# Patient Record
Sex: Male | Born: 1955 | Race: White | Hispanic: No | Marital: Married | Smoking: Current some day smoker
Health system: Southern US, Community
[De-identification: ages and names within clinical notes are randomized; demographics above are authoritative.]

## PROBLEM LIST (undated history)

## (undated) DIAGNOSIS — Z8659 Personal history of other mental and behavioral disorders: Secondary | ICD-10-CM

## (undated) DIAGNOSIS — C4491 Basal cell carcinoma of skin, unspecified: Secondary | ICD-10-CM

## (undated) DIAGNOSIS — I429 Cardiomyopathy, unspecified: Secondary | ICD-10-CM

## (undated) DIAGNOSIS — N200 Calculus of kidney: Secondary | ICD-10-CM

## (undated) HISTORY — PX: SKIN SURGERY: SHX2413

## (undated) HISTORY — DX: Basal cell carcinoma of skin, unspecified: C44.91

## (undated) HISTORY — PX: DEBRIDEMENT TENNIS ELBOW: SHX1442

---

## 1998-05-08 HISTORY — PX: HERNIA REPAIR: SHX51

## 2010-11-24 DIAGNOSIS — N400 Enlarged prostate without lower urinary tract symptoms: Secondary | ICD-10-CM | POA: Insufficient documentation

## 2012-12-21 ENCOUNTER — Emergency Department
Admission: EM | Admit: 2012-12-21 | Discharge: 2012-12-21 | Disposition: A | Discharge status: Home / Self Care | Payer: Managed Care, Other (non HMO) | Source: Home / Self Care | Attending: Family Medicine | Admitting: Family Medicine

## 2012-12-21 DIAGNOSIS — F341 Dysthymic disorder: Secondary | ICD-10-CM

## 2012-12-21 DIAGNOSIS — F329 Major depressive disorder, single episode, unspecified: Secondary | ICD-10-CM

## 2012-12-21 DIAGNOSIS — Z8659 Personal history of other mental and behavioral disorders: Secondary | ICD-10-CM | POA: Insufficient documentation

## 2012-12-21 DIAGNOSIS — F418 Other specified anxiety disorders: Secondary | ICD-10-CM

## 2012-12-21 HISTORY — DX: Personal history of other mental and behavioral disorders: Z86.59

## 2012-12-21 MED ORDER — CITALOPRAM HYDROBROMIDE 20 MG PO TABS: 20.0000 mg | ORAL_TABLET | Freq: Every day | ORAL | Status: DC

## 2012-12-21 MED ORDER — ALPRAZOLAM 1 MG PO TABS: ORAL_TABLET | ORAL | Status: DC

## 2012-12-21 NOTE — ED Notes (Signed)
Apollos states he has a lot of stress with his job. He has had loss of appetite, weight loss and trouble sleeping. He has felt this before and was treated with citalopram and xanax.

## 2012-12-21 NOTE — ED Provider Notes (Signed)
CSN: [id number]     Arrival date & time 12/21/12  1349 History     First MD Initiated Contact with Patient 12/21/12 1423     Chief Complaint  Patient presents with  . Anxiety    for 1 month, worse for 1 week  . Anorexia    for 1 month, worse for 1 week  . Insomnia    for 1 week      HPI Comments: Patient reports that he has been under increased stress at work for about a month.  He has become increasingly depressed and anxious, worse during the past week.  He states that his mind "races" and he has difficulty concentrating.  He has difficulty falling asleep and early morning awakening.  He has occasional mild panic attacks that he controls by slowing his respiration.  His appetite has decreased and he has lost about 10 pounds over the past month.  He denies suicidal thoughts.  He states that he had been depressed about 5 years ago, and was successfully treated with Celexa for 3 years.  His anxiety symptoms at that time were controlled with Xanax 1mg .  He has a history of Barrett's esophagus, and is scheduled to undergo follow-up endoscopy next month.  He will also be undergoing screening colonoscopy.                              The history is provided by the patient.    Past Medical History  Diagnosis Date  . History of anxiety    Past Surgical History  Procedure Laterality Date  . Hernia repair    . Debridement tennis elbow     History reviewed. No pertinent family history. History  Substance Use Topics  . Smoking status: Current Every Day Smoker -- 1.00 packs/day for 40 years    Types: Cigarettes  . Smokeless tobacco: Never Used  . Alcohol Use: No    Review of Systems  Constitutional: Positive for fever, activity change, appetite change, fatigue and unexpected weight change. Negative for chills and diaphoresis.  HENT: Negative.   Eyes: Negative.   Respiratory: Negative.   Cardiovascular: Negative.   Gastrointestinal: Positive for abdominal pain. Negative for nausea,  vomiting, diarrhea and blood in stool.  Endocrine: Negative.   Genitourinary: Negative for difficulty urinating.  Musculoskeletal: Negative.   Skin: Negative.   Neurological: Negative.   Psychiatric/Behavioral: Positive for confusion, sleep disturbance and decreased concentration. Negative for suicidal ideas, hallucinations, behavioral problems and agitation. The patient is nervous/anxious.     Allergies  Codeine  Home Medications   Current Outpatient Rx  Name  Route  Sig  Dispense  Refill  . ALPRAZolam (XANAX) 1 MG tablet      Take one tab PO BID to TID prn anxiety   20 tablet   0   . citalopram (CELEXA) 20 MG tablet   Oral   Take 1 tablet (20 mg total) by mouth daily.   10 tablet   0    BP 115/77  Pulse 78  Temp(Src) 98.4 F (36.9 C) (Oral)  Ht 5\' 10"  (1.778 m)  Wt 118 lb (53.524 kg)  BMI 16.93 kg/m2  SpO2 98% Physical Exam Nursing notes and Vital Signs reviewed. Appearance:  Patient appears older than stated age, and in no acute distress.   Psychiatric:  Patient is alert and oriented with good eye contact.  Thoughts are organized.  No psychomotor retardation.  Memory intact.  Not suicidal.  Affect is flat and mood mildly depressed.  Eyes:  Pupils are equal, round, and reactive to light and accomodation.  Extraocular movement is intact.  Conjunctivae are not inflamed  Pharynx:  Normal Neck:  Supple.   No adenopathy or thyromegaly.  Lungs:  Clear to auscultation.  Breath sounds are equal.  Heart:  Regular rate and rhythm without murmurs, rubs, or gallops.  Abdomen:  Nontender without masses or hepatosplenomegaly.  Bowel sounds are present.  No CVA or flank tenderness.  Extremities:  No edema.  No calf tenderness Skin:  No rash present.   ED Course   Procedures none    1. Depression   2. Anxiety associated with depression     MDM  Begin Celexa 20mg  daily, Xanax 1mg  bid to tid prn Followup with PCP in one week.  Lattie Haw, MD 12/21/12 1640

## 2012-12-27 ENCOUNTER — Encounter: Payer: Self-pay | Admitting: Family Medicine

## 2012-12-27 ENCOUNTER — Ambulatory Visit (INDEPENDENT_AMBULATORY_CARE_PROVIDER_SITE_OTHER): Payer: Managed Care, Other (non HMO) | Admitting: Family Medicine

## 2012-12-27 VITALS — BP 110/68 | HR 63 | Ht 70.0 in | Wt 120.0 lb

## 2012-12-27 DIAGNOSIS — Z131 Encounter for screening for diabetes mellitus: Secondary | ICD-10-CM

## 2012-12-27 DIAGNOSIS — F411 Generalized anxiety disorder: Secondary | ICD-10-CM | POA: Insufficient documentation

## 2012-12-27 DIAGNOSIS — Z13220 Encounter for screening for lipoid disorders: Secondary | ICD-10-CM

## 2012-12-27 LAB — BASIC METABOLIC PANEL WITH GFR
BUN: 25 mg/dL — ABNORMAL HIGH (ref 6–23)
Chloride: 108 mEq/L (ref 96–112)
GFR, Est Non African American: 78 mL/min
Potassium: 4.4 mEq/L (ref 3.5–5.3)
Sodium: 143 mEq/L (ref 135–145)

## 2012-12-27 LAB — LIPID PANEL
Cholesterol: 113 mg/dL (ref 0–200)
VLDL: 27 mg/dL (ref 0–40)

## 2012-12-27 MED ORDER — CITALOPRAM HYDROBROMIDE 20 MG PO TABS: 20.0000 mg | ORAL_TABLET | Freq: Every day | ORAL | Status: DC

## 2012-12-27 MED ORDER — ALPRAZOLAM 1 MG PO TABS: ORAL_TABLET | ORAL | Status: DC

## 2012-12-27 NOTE — Progress Notes (Signed)
CC: Dylan Potter is a 57 y.o. male is here for Establish Care   Subjective: HPI:  Very pleasant 57 year old here to establish care  Patient complains of one month of restlessness racing thoughts trouble sleeping upset stomach and poor appetite all described as moderate severity present on a daily basis worsening since onset. Symptoms seem to have started sometime around his wife's surgery on her cervical spine along with increased job responsibilities at work.  He reports similar symptoms approximately 10 years ago he had been on Lexapro at that time along with alprazolam as needed he tapered himself off years ago has not been on any medications for the past at least 4 years. Last week he started citalopram on a daily basis he has noticed a big improvement with now lack of appetite or abdominal complaints. He still has occasional racing thoughts nothing in particular at night however this is also greatly improved with Xanax only at night. No longer having trouble sleeping no longer having restlessness but does report mild subjective anxiety. He is quite happy with his response already. No family history of thyroid disease he tells me this has been checked within the last few years. He denies paranoia, hallucinations, nor subjective depression  Review of Systems - General ROS: negative for - chills, fever, night sweats, weight gain or weight loss Ophthalmic ROS: negative for - decreased vision Psychological ROS: negative for -  depression ENT ROS: negative for - hearing change, nasal congestion, tinnitus or allergies Hematological and Lymphatic ROS: negative for - bleeding problems, bruising or swollen lymph nodes Breast ROS: negative Respiratory ROS: no cough, shortness of breath, or wheezing Cardiovascular ROS: no chest pain or dyspnea on exertion Gastrointestinal ROS: no abdominal pain, change in bowel habits, or black or bloody stools Genito-Urinary ROS: negative for - genital discharge, genital  ulcers, incontinence or abnormal bleeding from genitals Musculoskeletal ROS: negative for - joint pain or muscle pain Neurological ROS: negative for - headaches or memory loss Dermatological ROS: negative for lumps, mole changes, rash and skin lesion changes  Past Medical History  Diagnosis Date  . History of anxiety      History reviewed. No pertinent family history.   History  Substance Use Topics  . Smoking status: Current Every Day Smoker -- 1.00 packs/day for 40 years    Types: Cigarettes  . Smokeless tobacco: Never Used  . Alcohol Use: No     Objective: Filed Vitals:   12/27/12 0903  BP: 110/68  Pulse: 63    General: Alert and Oriented, No Acute Distress HEENT: Pupils equal, round, reactive to light. Conjunctivae clear.  External ears unremarkable, canals clear with intact TMs with appropriate landmarks.  Middle ear appears open without effusion. Pink inferior turbinates.  Moist mucous membranes, pharynx without inflammation nor lesions.  Neck supple without palpable lymphadenopathy nor abnormal masses. Lungs: Clear to auscultation bilaterally, no wheezing/ronchi/rales.  Comfortable work of breathing. Good air movement. Cardiac: Regular rate and rhythm. Normal S1/S2.  No murmurs, rubs, nor gallops.   Abdomen:  soft nontender  Extremities: No peripheral edema.  Strong peripheral pulses.  Mental Status: No depression, anxiety, nor agitation. Skin: Warm and dry.  Assessment & Plan: Dylan Potter was seen today for establish care.  Diagnoses and associated orders for this visit:  Generalized anxiety disorder - ALPRAZolam (XANAX) 1 MG tablet; One by mouth only as needed for sleep. - citalopram (CELEXA) 20 MG tablet; Take 1 tablet (20 mg total) by mouth daily.  Diabetes mellitus screening - BASIC  METABOLIC PANEL WITH GFR  Screening, lipid - Lipid panel    Anxiety: Improved continue citalopram, we discussed we can increase this dose if needed however we're both optimistic  that this will continue to be improving, continue as needed alprazolam for sleep He is due for lipid screen and diabetic screening.  Return in about 3 months (around 03/29/2013) for anxiety.

## 2013-02-23 ENCOUNTER — Other Ambulatory Visit: Payer: Self-pay | Admitting: Family Medicine

## 2013-02-24 NOTE — Telephone Encounter (Signed)
Dylan Potter, Rx placed in in-box ready for pickup/faxing.  

## 2013-03-13 ENCOUNTER — Other Ambulatory Visit: Payer: Self-pay

## 2013-03-31 ENCOUNTER — Encounter: Payer: Self-pay | Admitting: Family Medicine

## 2013-03-31 ENCOUNTER — Ambulatory Visit (INDEPENDENT_AMBULATORY_CARE_PROVIDER_SITE_OTHER): Payer: Managed Care, Other (non HMO) | Admitting: Family Medicine

## 2013-03-31 VITALS — BP 113/78 | HR 75 | Wt 126.0 lb

## 2013-03-31 DIAGNOSIS — F411 Generalized anxiety disorder: Secondary | ICD-10-CM

## 2013-03-31 DIAGNOSIS — K222 Esophageal obstruction: Secondary | ICD-10-CM | POA: Insufficient documentation

## 2013-03-31 MED ORDER — ALPRAZOLAM 1 MG PO TABS: ORAL_TABLET | ORAL | Status: DC

## 2013-03-31 MED ORDER — CITALOPRAM HYDROBROMIDE 40 MG PO TABS: 40.0000 mg | ORAL_TABLET | Freq: Every day | ORAL | Status: DC

## 2013-03-31 NOTE — Progress Notes (Signed)
CC: Dylan Potter is a 57 y.o. male is here for anxiety and depression f/u   Subjective: HPI:  Followup generalized anxiety disorder: Symptoms of mood swings and irritability with subjective anxiety was improving following his wife's recovery from neck surgery however over the past one-2 months symptoms have been worsening from mild to moderate severely on a daily basis due to leaving his job and being unsuccessful with looking for a new job. Symptoms are improved in the evening with taking alprazolam, he feels that citalopram 20 mg is helping some but that there is room for improvement.[name] denies subjective depression, mental disturbance other than above. Denies paranoia, insomnia.   Review Of Systems Outlined In HPI  Past Medical History  Diagnosis Date  . History of anxiety      Family History  Problem Relation Age of Onset  . Hyperlipidemia Mother      History  Substance Use Topics  . Smoking status: Current Every Day Smoker -- 1.00 packs/day for 40 years    Types: Cigarettes  . Smokeless tobacco: Never Used  . Alcohol Use: No     Objective: Filed Vitals:   03/31/13 0828  BP: 113/78  Pulse: 75    Vital signs reviewed. General: Alert and Oriented, No Acute Distress HEENT: Pupils equal, round, reactive to light. Conjunctivae clear.  External ears unremarkable.  Moist mucous membranes. Lungs: Clear and comfortable work of breathing, speaking in full sentences without accessory muscle use. Cardiac: Regular rate and rhythm.  Neuro: CN II-XII grossly intact, gait normal. Extremities: No peripheral edema.  Strong peripheral pulses.  Mental Status: No depression, anxiety, nor agitation. Logical though process. Skin: Warm and dry.  Assessment & Plan: Sergi was seen today for anxiety and depression f/u.  Diagnoses and associated orders for this visit:  Generalized anxiety disorder - citalopram (CELEXA) 40 MG tablet; Take 1 tablet (40 mg total) by mouth daily. - ALPRAZolam  (XANAX) 1 MG tablet; TAKE 1 TABLET BY MOUTH AT BEDTIME  Esophageal stricture    Generalized anxiety disorder: Uncontrolled, increasing citalopram return in one month if quality of life has not significantly improved. Continue as needed alprazolam at bedtime only.   Return in about 3 months (around 07/01/2013).

## 2013-05-19 ENCOUNTER — Encounter: Payer: Self-pay | Admitting: Family Medicine

## 2013-05-19 ENCOUNTER — Telehealth: Payer: Self-pay | Admitting: Family Medicine

## 2013-05-19 DIAGNOSIS — F411 Generalized anxiety disorder: Secondary | ICD-10-CM

## 2013-05-19 MED ORDER — ALPRAZOLAM 1 MG PO TABS: ORAL_TABLET | ORAL | Status: DC

## 2013-05-19 NOTE — Telephone Encounter (Signed)
Sue LushAndrea, Rx placed in in-box ready for pickup/faxing.   ----- Message ----- From: Ulis Riasavid Troupe Sent: 05/19/2013 11:05 AM To: [facility] Clinical Pool Subject: Non-Urgent Medical Question I was e-mailing to get a refill authorization on Alprazolam 1MG  from DR. Barrie Sigmund. Thank-You

## 2013-05-20 NOTE — Telephone Encounter (Signed)
rx faxed

## 2013-07-01 ENCOUNTER — Ambulatory Visit: Payer: Self-pay | Admitting: Family Medicine

## 2013-07-01 ENCOUNTER — Ambulatory Visit (INDEPENDENT_AMBULATORY_CARE_PROVIDER_SITE_OTHER): Payer: Self-pay | Admitting: Family Medicine

## 2013-07-01 ENCOUNTER — Encounter: Payer: Self-pay | Admitting: Family Medicine

## 2013-07-01 VITALS — BP 106/61 | HR 67 | Temp 97.0°F | Ht 70.0 in | Wt 130.0 lb

## 2013-07-01 DIAGNOSIS — G4700 Insomnia, unspecified: Secondary | ICD-10-CM | POA: Insufficient documentation

## 2013-07-01 DIAGNOSIS — F411 Generalized anxiety disorder: Secondary | ICD-10-CM

## 2013-07-01 MED ORDER — TEMAZEPAM 7.5 MG PO CAPS: 7.5000 mg | ORAL_CAPSULE | Freq: Every evening | ORAL | Status: DC | PRN

## 2013-07-01 MED ORDER — TEMAZEPAM 15 MG PO CAPS: ORAL_CAPSULE | ORAL | Status: DC

## 2013-07-01 NOTE — Progress Notes (Signed)
CC: Dylan Potter is a 58 y.o. male is here for Medication Dose Change   Subjective: HPI:  Followup anxiety: Since her last visit he went on citalopram was taking 40 mg on a daily basis soon after this adjustment he developed night sweats and worsening insomnia. Cut back to 20 mg with immediate resolution of above anxiety. Currently anxiety is well controlled he denies any mental disturbance or depression other than trouble with insomnia.    complains of worsening insomnia over the past 2 months on a nightly basis. Nothing particularly makes this worse he cannot identify anything that is causing the racing thoughts that are keeping him awake. He denies paranoia or reoccurring thoughts causing him difficulty sleeping. He finds this as difficulty getting asleep and staying asleep. Slightly improved with taking 150% of his prescribe Xanax every night however he believes he is building a tolerance to this. Denies fevers, chills, unintentional weight loss or gain. Reports over a decade ago he tried Ambien and Lunesta for separate occasions without much benefit   Review Of Systems Outlined In HPI  Past Medical History  Diagnosis Date  . History of anxiety     Past Surgical History  Procedure Laterality Date  . Hernia repair  2000  . Debridement tennis elbow  2001, 2002   Family History  Problem Relation Age of Onset  . Hyperlipidemia Mother     History   Social History  . Marital Status: Married    Spouse Name: N/A    Number of Children: N/A  . Years of Education: N/A   Occupational History  . Not on file.   Social History Main Topics  . Smoking status: Current Every Day Smoker -- 1.00 packs/day for 40 years    Types: Cigarettes  . Smokeless tobacco: Never Used  . Alcohol Use: No  . Drug Use: No  . Sexual Activity: Yes   Other Topics Concern  . Not on file   Social History Narrative  . No narrative on file     Objective: BP 106/61  Pulse 67  Temp(Src) 97 F (36.1 C)   Ht 5\' 10"  (1.778 m)  Wt 130 lb (58.968 kg)  BMI 18.65 kg/m2  SpO2 97%  Vital signs reviewed. General: Alert and Oriented, No Acute Distress HEENT: Pupils equal, round, reactive to light. Conjunctivae clear.  External ears unremarkable.  Moist mucous membranes. Lungs: Clear and comfortable work of breathing, speaking in full sentences without accessory muscle use. Cardiac: Regular rate and rhythm.  Neuro: CN II-XII grossly intact, gait normal. Extremities: No peripheral edema.  Strong peripheral pulses.  Mental Status: No depression, anxiety, nor agitation. Logical though process. Skin: Warm and dry.  Assessment & Plan: [name] was seen today for medication dose change.  Diagnoses and associated orders for this visit:  Generalized anxiety disorder  Insomnia - temazepam (RESTORIL) 15 MG capsule; Start with half tablet at bedtime daily for the first five days the full tablet at bedtime.  Other Orders - temazepam (RESTORIL) 7.5 MG capsule; Take 1 capsule (7.5 mg total) by mouth at bedtime as needed for sleep.    Generalized anxiety disorder: Controlled continue citalopram 20 mg   insomnia: Uncontrolled chronic condition tapering off of Xanax to take 0.5 mg on a nightly basis for 5 days along with 7.5 temazepam a nightly basis for 5 days then completely stopped Xanax switch to temazepam   Return in about 3 months (around 09/28/2013).

## 2013-07-07 ENCOUNTER — Encounter: Payer: Self-pay | Admitting: Family Medicine

## 2013-07-08 ENCOUNTER — Telehealth: Payer: Self-pay | Admitting: *Deleted

## 2013-07-08 MED ORDER — ALPRAZOLAM 2 MG PO TABS: 1.0000 mg | ORAL_TABLET | Freq: Every evening | ORAL | Status: DC | PRN

## 2013-07-08 NOTE — Telephone Encounter (Signed)
New xanax rx in Andrea's inbox, please inform patient that the dose of this was increased from 1mg  to 2mg  to avoid any confusion.

## 2013-07-08 NOTE — Telephone Encounter (Signed)
Pt called and sent an email stating the temazepam causing him to have HA, night sweats, still not helping with sleep. Pt wants to switch back to xanax.Please advise

## 2013-07-09 NOTE — Telephone Encounter (Signed)
Pt notified and rx faxed 

## 2013-07-30 ENCOUNTER — Encounter: Payer: Self-pay | Admitting: Family Medicine

## 2013-07-31 ENCOUNTER — Other Ambulatory Visit: Payer: Self-pay | Admitting: Family Medicine

## 2013-08-05 MED ORDER — ZOLPIDEM TARTRATE 10 MG PO TABS: 10.0000 mg | ORAL_TABLET | Freq: Every evening | ORAL | Status: DC | PRN

## 2013-08-07 ENCOUNTER — Telehealth: Payer: Self-pay | Admitting: Family Medicine

## 2013-08-11 MED ORDER — ALPRAZOLAM 2 MG PO TABS: 1.0000 mg | ORAL_TABLET | Freq: Every evening | ORAL | Status: DC | PRN

## 2013-08-11 NOTE — Telephone Encounter (Signed)
Dylan Potter, Rx placed in in-box ready for pickup/faxing.  

## 2013-08-11 NOTE — Telephone Encounter (Signed)
rx has been faxed 

## 2013-09-02 ENCOUNTER — Encounter: Payer: Self-pay | Admitting: Family Medicine

## 2013-09-02 MED ORDER — TEMAZEPAM 30 MG PO CAPS: 30.0000 mg | ORAL_CAPSULE | Freq: Every evening | ORAL | Status: DC | PRN

## 2013-09-02 NOTE — Telephone Encounter (Signed)
Dylan Potter, Rx placed in in-box ready for pickup/faxing.  

## 2013-09-25 ENCOUNTER — Other Ambulatory Visit: Payer: Self-pay | Admitting: Family Medicine

## 2013-09-30 ENCOUNTER — Ambulatory Visit: Payer: Self-pay | Admitting: Family Medicine

## 2013-10-10 ENCOUNTER — Telehealth: Payer: Self-pay | Admitting: Family Medicine

## 2013-10-10 ENCOUNTER — Other Ambulatory Visit: Payer: Self-pay | Admitting: Family Medicine

## 2013-10-13 MED ORDER — TEMAZEPAM 30 MG PO CAPS: 30.0000 mg | ORAL_CAPSULE | Freq: Every evening | ORAL | Status: DC | PRN

## 2013-10-13 NOTE — Telephone Encounter (Signed)
Dylan Potter, Rx placed in in-box ready for pickup/faxing.  

## 2013-10-13 NOTE — Telephone Encounter (Signed)
faxed

## 2013-12-15 ENCOUNTER — Other Ambulatory Visit: Payer: Self-pay | Admitting: Family Medicine

## 2013-12-15 MED ORDER — TEMAZEPAM 30 MG PO CAPS: 30.0000 mg | ORAL_CAPSULE | Freq: Every evening | ORAL | Status: DC | PRN

## 2013-12-19 ENCOUNTER — Encounter: Payer: Self-pay | Admitting: Family Medicine

## 2013-12-19 ENCOUNTER — Ambulatory Visit (INDEPENDENT_AMBULATORY_CARE_PROVIDER_SITE_OTHER): Payer: Self-pay | Admitting: Family Medicine

## 2013-12-19 VITALS — BP 133/85 | HR 64 | Temp 97.6°F | Wt 130.0 lb

## 2013-12-19 DIAGNOSIS — G4700 Insomnia, unspecified: Secondary | ICD-10-CM

## 2013-12-19 DIAGNOSIS — R319 Hematuria, unspecified: Secondary | ICD-10-CM | POA: Insufficient documentation

## 2013-12-19 DIAGNOSIS — F411 Generalized anxiety disorder: Secondary | ICD-10-CM

## 2013-12-19 LAB — POCT URINALYSIS DIPSTICK
BILIRUBIN UA: NEGATIVE
GLUCOSE UA: NEGATIVE
Ketones, UA: NEGATIVE
Leukocytes, UA: NEGATIVE
Nitrite, UA: NEGATIVE
Protein, UA: NEGATIVE
Urobilinogen, UA: 0.2
pH, UA: 5.5

## 2013-12-19 NOTE — Progress Notes (Signed)
CC: Dylan Potter is a 58 y.o. male is here for Hematuria   Subjective: HPI:  Patient reports diagnosis of hematuria that was noticed that a physical he had done earlier this week. He has not noticed any gross hematuria recently or remotely. He denies any penile discharge, dysuria, testicular pain, flank pain, fevers, chills, weak urine stream, needing to awaken in the middle of the night to urinate, nor any bleeding problems recently or remotely. Does not take any anti-coagulation or antiplatelet medication. No family history of prostate nor bladder cancer  Followup insomnia: States that temazepam is working great to fall sleep and stay asleep. Denies known intolerance or side effects. Reports restorative sleep on a daily/nightly basis  Followup anxiety: Continues to take citalopram on a daily basis without known side effects denies intolerance. Denies any anxiety or any other mental disturbance   Review Of Systems Outlined In HPI  Past Medical History  Diagnosis Date  . History of anxiety     Past Surgical History  Procedure Laterality Date  . Hernia repair  2000  . Debridement tennis elbow  2001, 2002   Family History  Problem Relation Age of Onset  . Hyperlipidemia Mother     History   Social History  . Marital Status: Married    Spouse Name: N/A    Number of Children: N/A  . Years of Education: N/A   Occupational History  . Not on file.   Social History Main Topics  . Smoking status: Current Every Day Smoker -- 1.00 packs/day for 40 years    Types: Cigarettes  . Smokeless tobacco: Never Used  . Alcohol Use: No  . Drug Use: No  . Sexual Activity: Yes   Other Topics Concern  . Not on file   Social History Narrative  . No narrative on file     Objective: BP 133/85  Pulse 64  Temp(Src) 97.6 F (36.4 C) (Oral)  Wt 130 lb (58.968 kg)  Vital signs reviewed. General: Alert and Oriented, No Acute Distress HEENT: Pupils equal, round, reactive to light.  Conjunctivae clear.  External ears unremarkable.  Moist mucous membranes. Lungs: Clear and comfortable work of breathing, speaking in full sentences without accessory muscle use. Cardiac: Regular rate and rhythm.  Neuro: CN II-XII grossly intact, gait normal. Extremities: No peripheral edema.  Strong peripheral pulses.  Mental Status: No depression, anxiety, nor agitation. Logical though process. Skin: Warm and dry. Assessment & Plan: Dylan Potter was seen today for hematuria.  Diagnoses and associated orders for this visit:  Hematuria - Urine Culture - Urinalysis Dipstick - Urinalysis, microscopic only  Generalized anxiety disorder  Insomnia    Hematuria: Obtain culture rule out UTI, obtaining microscopic analysis to quantify RBCs to help determine if further workup is needed. Generalized anxiety disorder: Controlled continue citalopram Insomnia: Controlled continue Restoril   Return if symptoms worsen or fail to improve.

## 2013-12-20 LAB — URINALYSIS, MICROSCOPIC ONLY
BACTERIA UA: NONE SEEN
CASTS: NONE SEEN
CRYSTALS: NONE SEEN
Squamous Epithelial / LPF: NONE SEEN

## 2013-12-21 LAB — URINE CULTURE
COLONY COUNT: NO GROWTH
Organism ID, Bacteria: NO GROWTH

## 2013-12-22 ENCOUNTER — Telehealth: Payer: Self-pay | Admitting: Family Medicine

## 2013-12-22 DIAGNOSIS — R319 Hematuria, unspecified: Secondary | ICD-10-CM

## 2013-12-22 NOTE — Telephone Encounter (Signed)
Dylan Potter, Will you please let patient know that the amount of blood in his urine is not considered to be significant for further workup.  This is very reassuring and not a sign of any significant disease.  I'll write a letter that he can share with his potential employer (placed in your inbox).

## 2013-12-22 NOTE — Telephone Encounter (Signed)
Pt.notified

## 2014-01-16 ENCOUNTER — Encounter: Payer: Self-pay | Admitting: Family Medicine

## 2014-01-21 ENCOUNTER — Other Ambulatory Visit: Payer: Self-pay | Admitting: *Deleted

## 2014-01-21 DIAGNOSIS — F411 Generalized anxiety disorder: Secondary | ICD-10-CM

## 2014-01-21 MED ORDER — TEMAZEPAM 30 MG PO CAPS: 30.0000 mg | ORAL_CAPSULE | Freq: Every evening | ORAL | Status: DC | PRN

## 2014-01-21 MED ORDER — CITALOPRAM HYDROBROMIDE 40 MG PO TABS: 40.0000 mg | ORAL_TABLET | Freq: Every day | ORAL | Status: DC

## 2014-03-17 ENCOUNTER — Other Ambulatory Visit: Payer: Self-pay | Admitting: Family Medicine

## 2014-03-18 ENCOUNTER — Encounter: Payer: Self-pay | Admitting: Family Medicine

## 2014-03-19 ENCOUNTER — Other Ambulatory Visit: Payer: Self-pay

## 2014-03-19 MED ORDER — TEMAZEPAM 30 MG PO CAPS: 30.0000 mg | ORAL_CAPSULE | Freq: Every evening | ORAL | Status: DC | PRN

## 2014-03-19 NOTE — Telephone Encounter (Signed)
Patient request refill for Temazepam 30 mg #30 1 Refill has been faxed to [facility]. Estelle Junehonda Cunningham,CMA

## 2014-05-22 ENCOUNTER — Other Ambulatory Visit: Payer: Self-pay | Admitting: *Deleted

## 2014-05-22 MED ORDER — TEMAZEPAM 30 MG PO CAPS: 30.0000 mg | ORAL_CAPSULE | Freq: Every evening | ORAL | Status: DC | PRN

## 2014-05-24 ENCOUNTER — Other Ambulatory Visit: Payer: Self-pay | Admitting: Family Medicine

## 2014-10-16 ENCOUNTER — Ambulatory Visit (INDEPENDENT_AMBULATORY_CARE_PROVIDER_SITE_OTHER): Payer: Self-pay | Admitting: Family Medicine

## 2014-10-16 ENCOUNTER — Encounter: Payer: Self-pay | Admitting: Family Medicine

## 2014-10-16 VITALS — BP 131/83 | HR 77 | Ht 70.0 in | Wt 131.0 lb

## 2014-10-16 DIAGNOSIS — R1903 Right lower quadrant abdominal swelling, mass and lump: Secondary | ICD-10-CM

## 2014-10-16 DIAGNOSIS — R319 Hematuria, unspecified: Secondary | ICD-10-CM

## 2014-10-16 DIAGNOSIS — G4700 Insomnia, unspecified: Secondary | ICD-10-CM

## 2014-10-16 LAB — POCT URINALYSIS DIPSTICK
BILIRUBIN UA: NEGATIVE
GLUCOSE UA: NEGATIVE
KETONES UA: NEGATIVE
LEUKOCYTES UA: NEGATIVE
Nitrite, UA: NEGATIVE
PH UA: 6.5
Protein, UA: NEGATIVE
SPEC GRAV UA: 1.02
UROBILINOGEN UA: 0.2

## 2014-10-16 MED ORDER — ALPRAZOLAM 2 MG PO TABS: 1.0000 mg | ORAL_TABLET | Freq: Every evening | ORAL | Status: DC | PRN

## 2014-10-16 NOTE — Progress Notes (Signed)
CC: Dylan Potter is a 59 y.o. male is here for Hematuria   Subjective: HPI:  Recent preemployment physical he was noted to have blood on her urinalysis dipstick. He denies any gross blood or dysuria. He's had this situation in the past and under microscopy there was an insignificant amount of red blood cells. He denies any urinary symptoms or penile discharge  He also accidentally listed that he has sleep apnea thinking that this was synonymous with insomnia she does suffer from. Currently this is well controlled though with the use of alprazolam at night right before bed. He has no history of sleep apnea  When he was being examined the practitioner felt that he could have a hernia in the rightgroin and lower quadrant. He admits that he does have some softness in the right groin compared to the left because of mesh that was placed in the left groin for hernia years ago. No operation is occurred in the right groin or right lower quadrant . he denies any pain or gastrointestinal complaints diarrhea constipation or nausea  Review Of Systems Outlined In HPI  Past Medical History  Diagnosis Date  . History of anxiety     Past Surgical History  Procedure Laterality Date  . Hernia repair  2000  . Debridement tennis elbow  2001, 2002   Family History  Problem Relation Age of Onset  . Hyperlipidemia Mother     History   Social History  . Marital Status: Married    Spouse Name: N/A  . Number of Children: N/A  . Years of Education: N/A   Occupational History  . Not on file.   Social History Main Topics  . Smoking status: Current Every Day Smoker -- 1.00 packs/day for 40 years    Types: Cigarettes  . Smokeless tobacco: Never Used  . Alcohol Use: No  . Drug Use: No  . Sexual Activity: Yes   Other Topics Concern  . Not on file   Social History Narrative     Objective: BP 131/83 mmHg  Pulse 77  Ht 5\' 10"  (1.778 m)  Wt 131 lb (59.421 kg)  BMI 18.80 kg/m2  Vital signs  reviewed. General: Alert and Oriented, No Acute Distress HEENT: Pupils equal, round, reactive to light. Conjunctivae clear.  External ears unremarkable.  Moist mucous membranes. Lungs: Clear and comfortable work of breathing, speaking in full sentences without accessory muscle use. Cardiac: Regular rate and rhythm.  Neuro: CN II-XII grossly intact, gait normal. Abdomen: Soft nontender no rebound or guarding. No palpable masses. The right groin is a little bit softer than the left however no palpable hernia. Extremities: No peripheral edema.  Strong peripheral pulses.  Mental Status: No depression, anxiety, nor agitation. Logical though process. Skin: Warm and dry.  Assessment & Plan: Dylan Potter was seen today for hematuria.  Diagnoses and all orders for this visit:  Hematuria Orders: -     Urinalysis, microscopic only -     POCT Urinalysis Dipstick  Insomnia  Right lower quadrant abdominal mass Orders: -     alprazolam (XANAX) 2 MG tablet; Take 0.5-1 tablets (1-2 mg total) by mouth at bedtime as needed for sleep.    Needs letter regarding blood in urine situation, that he does not have sleep apnea and listed this by mistake thinking it was synonymous with insomnia, and that he does not have a hernia.  Checking microscopic urinalysis to determine if he has microscopic hematuria and needs a further workup. The results of  this need to be faxed to [facility] @ [phone]  Return if symptoms worsen or fail to improve.

## 2014-10-17 LAB — URINALYSIS, MICROSCOPIC ONLY
Bacteria, UA: NONE SEEN
CRYSTALS: NONE SEEN
Casts: NONE SEEN
Squamous Epithelial / LPF: NONE SEEN

## 2014-10-19 ENCOUNTER — Telehealth: Payer: Self-pay | Admitting: Family Medicine

## 2014-10-19 NOTE — Telephone Encounter (Signed)
faxed

## 2014-10-19 NOTE — Telephone Encounter (Signed)
Sue Lush, Will you please fax Mr. Fieldhouse' letter that I put in your inbox to [phone] and then mail it to Mr. Fodera?

## 2014-12-06 ENCOUNTER — Other Ambulatory Visit: Payer: Self-pay | Admitting: Family Medicine

## 2014-12-08 NOTE — Telephone Encounter (Signed)
Dylan Potter, Rx placed in in-box ready for pickup/faxing.  

## 2015-02-01 ENCOUNTER — Encounter: Payer: Self-pay | Admitting: Family Medicine

## 2015-02-22 ENCOUNTER — Other Ambulatory Visit: Payer: Self-pay | Admitting: Family Medicine

## 2015-02-25 ENCOUNTER — Other Ambulatory Visit: Payer: Self-pay | Admitting: Family Medicine

## 2015-02-26 NOTE — Telephone Encounter (Signed)
Pt pharmacy is requesting this refill.  Pt has not been since June of this year.

## 2015-02-26 NOTE — Telephone Encounter (Signed)
[place], Rx placed in in-box ready for pickup/faxing. Patient moved recently and hasn't found a  New PCP yet.

## 2015-03-02 ENCOUNTER — Other Ambulatory Visit: Payer: Self-pay | Admitting: Family Medicine

## 2015-03-02 ENCOUNTER — Encounter: Payer: Self-pay | Admitting: Family Medicine

## 2015-03-04 MED ORDER — ALPRAZOLAM 2 MG PO TABS: 1.0000 mg | ORAL_TABLET | Freq: Every evening | ORAL | Status: DC | PRN

## 2015-05-04 ENCOUNTER — Encounter: Payer: Self-pay | Admitting: Family Medicine

## 2015-05-04 MED ORDER — ALPRAZOLAM 2 MG PO TABS: 1.0000 mg | ORAL_TABLET | Freq: Every evening | ORAL | Status: DC | PRN

## 2015-07-06 ENCOUNTER — Other Ambulatory Visit: Payer: Self-pay | Admitting: Family Medicine

## 2015-07-09 ENCOUNTER — Other Ambulatory Visit: Payer: Self-pay | Admitting: Family Medicine

## 2015-07-10 ENCOUNTER — Encounter: Payer: Self-pay | Admitting: Family Medicine

## 2015-07-12 ENCOUNTER — Telehealth: Payer: Self-pay | Admitting: Family Medicine

## 2015-07-12 NOTE — Telephone Encounter (Signed)
I called pt and talk to him and informed him that he is due for a f/u appt with Dr.[name] for a Anxiety and he states that he is working out of town right now and will have to call back

## 2015-08-19 ENCOUNTER — Ambulatory Visit (INDEPENDENT_AMBULATORY_CARE_PROVIDER_SITE_OTHER): Payer: BLUE CROSS/BLUE SHIELD | Admitting: Family Medicine

## 2015-08-19 ENCOUNTER — Encounter: Payer: Self-pay | Admitting: Family Medicine

## 2015-08-19 VITALS — BP 134/79 | HR 62 | Wt 119.0 lb

## 2015-08-19 DIAGNOSIS — F411 Generalized anxiety disorder: Secondary | ICD-10-CM

## 2015-08-19 DIAGNOSIS — R319 Hematuria, unspecified: Secondary | ICD-10-CM | POA: Diagnosis not present

## 2015-08-19 MED ORDER — ALPRAZOLAM 2 MG PO TABS: ORAL_TABLET | ORAL | Status: DC

## 2015-08-19 NOTE — Progress Notes (Signed)
CC: Dylan Potter is a 60 y.o. male is here for Medication Refill   Subjective: HPI:  Since I saw him last he denies any gross blood in his urine and he thinks that eating tested again after I had a normal urinalysis and again his urine did not show any signs of blood. He denies any dysuria or any other genitourinary complaints.  Follow-up anxiety: He is requesting a refill on Xanax. He tried to wean himself off of this over the last month and once he got down to not taking any Xanax he had severe difficulty falling asleep for 4 nights in a row. He tells me that his anxiousness kept him awake but there is nothing particular he was overly anxious about. Symptoms were worse with his job in [place] so his return back to [place]. He denies any depression or paranoia. No other mental disturbance.   Review Of Systems Outlined In HPI  Past Medical History  Diagnosis Date  . History of anxiety     Past Surgical History  Procedure Laterality Date  . Hernia repair  2000  . Debridement tennis elbow  2001, 2002   Family History  Problem Relation Age of Onset  . Hyperlipidemia Mother     Social History   Social History  . Marital Status: Married    Spouse Name: N/A  . Number of Children: N/A  . Years of Education: N/A   Occupational History  . Not on file.   Social History Main Topics  . Smoking status: Current Every Day Smoker -- 1.00 packs/day for 40 years    Types: Cigarettes  . Smokeless tobacco: Never Used  . Alcohol Use: No  . Drug Use: No  . Sexual Activity: Yes   Other Topics Concern  . Not on file   Social History Narrative     Objective: BP 134/79 mmHg  Pulse 62  Wt 119 lb (53.978 kg)  General: Alert and Oriented, No Acute Distress HEENT: Pupils equal, round, reactive to light. Conjunctivae clear.   Lungs: Clear to auscultation bilaterally, no wheezing/ronchi/rales.  Comfortable work of breathing. Good air movement. Cardiac: Regular rate and rhythm.  Normal S1/S2.  No murmurs, rubs, nor gallops.   Extremities: No peripheral edema.  Strong peripheral pulses.  Mental Status: No depression, anxiety, nor agitation. Skin: Warm and dry.  Assessment & Plan: [name] was seen today for medication refill.  Diagnoses and all orders for this visit:  Generalized anxiety disorder  Hematuria  Other orders -     alprazolam (XANAX) 2 MG tablet; Take 1/2 to 1 tablet by mouth at bedtime as needed for sleep.  Follow up needed October 2017   Hematuria is resolved Gen. anxiety disorder keeping him awake at night therefore restart former regimen of Xanax. He does not need to follow-up until October as long as everything is going well.  Return in about 6 months (around 02/18/2016) for CPE.

## 2015-12-02 ENCOUNTER — Encounter: Payer: Self-pay | Admitting: Family Medicine

## 2015-12-02 MED ORDER — MELOXICAM 15 MG PO TABS: 15.0000 mg | ORAL_TABLET | Freq: Every day | ORAL | 1 refills | Status: DC | PRN

## 2016-03-01 ENCOUNTER — Telehealth: Payer: Self-pay | Admitting: Family Medicine

## 2016-03-01 NOTE — Telephone Encounter (Signed)
Pt called. He is a former pt of Dr Rexene EdisonH and is requesting refill on his Alprazolam, he's been off med for two weeks but realizes he needs to get back on it.  He has an appt scheduled for Nov 7th with Dr Lyn HollingsheadAlexander. [facility] [place].

## 2016-03-02 MED ORDER — ALPRAZOLAM 2 MG PO TABS: ORAL_TABLET | ORAL | 0 refills | Status: DC

## 2016-03-02 NOTE — Telephone Encounter (Signed)
Will fill x1 moths. F/u with Dr. Lyn HollingsheadAlexander

## 2016-03-14 ENCOUNTER — Ambulatory Visit (INDEPENDENT_AMBULATORY_CARE_PROVIDER_SITE_OTHER): Payer: Self-pay | Admitting: Osteopathic Medicine

## 2016-03-14 ENCOUNTER — Encounter: Payer: Self-pay | Admitting: Osteopathic Medicine

## 2016-03-14 VITALS — BP 124/76 | HR 74 | Wt 126.0 lb

## 2016-03-14 DIAGNOSIS — F5104 Psychophysiologic insomnia: Secondary | ICD-10-CM

## 2016-03-14 MED ORDER — ALPRAZOLAM 2 MG PO TABS: ORAL_TABLET | ORAL | 5 refills | Status: DC

## 2016-03-14 NOTE — Progress Notes (Signed)
HPI: Dylan Potter is a 60 y.o. male  who presents to Dylan Potter Dylan Potter today, 03/14/16,  for chief complaint of:  Chief Complaint  Patient presents with  . Establish Potter    Switching from [name]/ anxiety     Insomnia: Chronic, stable. Patient here for follow-up/alprazolam refill. Taking this pretty much every night, has tried to weight himself off in the past without any success. Typically will take half a tablet, often this helps but usually he has to take another half a tablet 2 mg pill to get to sleep. When she is asleep, no nighttime awakenings.  Currently patient is without insurance, but expects to come back in another month or so once he has started new job, states labs have been normal at employer physicals. Overdue for endoscopy - patient will take Potter of this once he has insurance again Overdue for labs/annual   [place] controlled substance database reviewed: Last fill Xanax #30 refill x0 from Dr Denyse Amassorey (Covering Dr Ivan AnchorsHommel) 03/02/16. Pt consistently getting 30 tablets approximately every month x1 year reviewed.    Past medical, surgical, social and family history reviewed: Past Medical History:  Diagnosis Date  . History of anxiety    Past Surgical History:  Procedure Laterality Date  . DEBRIDEMENT TENNIS ELBOW  2001, 2002  . HERNIA REPAIR  2000   Social History  Substance Use Topics  . Smoking status: Current Every Day Smoker    Packs/day: 1.00    Years: 40.00    Types: Cigarettes  . Smokeless tobacco: Never Used  . Alcohol use No   Family History  Problem Relation Age of Onset  . Hyperlipidemia Mother      Current medication list and allergy/intolerance information reviewed:   Current Outpatient Prescriptions on File Prior to Visit  Medication Sig Dispense Refill  . alprazolam (XANAX) 2 MG tablet Take 1/2 to 1 tablet by mouth at bedtime as needed for sleep.  Follow up needed October 2017 30 tablet 0   No current  facility-administered medications on file prior to visit.    Allergies  Allergen Reactions  . Ambien [Zolpidem Tartrate]     Sleepwalking  . Restoril [Temazepam]     No help with sleep  . Codeine Rash      Review of Systems:  Constitutional: No recent illness  Cardiac: No  chest pain  Respiratory:  No  shortness of breath.   Gastrointestinal: No  abdominal pain  Psychiatric: No  concerns with depression, +concerns with anxiety  Exam:  BP 124/76   Pulse 74   Wt 126 lb (57.2 kg)   BMI 18.08 kg/m   Constitutional: VS see above. General Appearance: alert, well-developed, well-nourished, NAD  Eyes: Normal lids and conjunctive, non-icteric sclera  Ears, Nose, Mouth, Throat: MMM, Normal external inspection ears/nares/mouth/lips/gums.  Neck: No masses, trachea midline.   Respiratory: Normal respiratory effort. no wheeze, no rhonchi, no rales. Diminished breath sounds bilaterally  Cardiovascular: S1/S2 normal, no murmur, no rub/gallop auscultated. RRR.   Musculoskeletal: Gait normal. Symmetric and independent movement of all extremities  Neurological: Normal balance/coordination. No tremor.  Skin: warm, dry, intact.   Psychiatric: Normal judgment/insight. Normal mood and affect. Oriented x3.    Depression screen PHQ 2/9 03/14/2016  Decreased Interest 1  Down, Depressed, Hopeless 1  PHQ - 2 Score 2  Altered sleeping 3  Tired, decreased energy 3  Change in appetite 0  Feeling bad or failure about yourself  1  Trouble concentrating 1  Moving slowly or fidgety/restless 1  Suicidal thoughts 0  PHQ-9 Score 11  Altered sleeping long-standing. Fatigue is a bit new, we'll revisit this at annual physical  GAD 7 : Generalized Anxiety Score 03/14/2016  Nervous, Anxious, on Edge 2  Control/stop worrying 1  Worry too much - different things 1  Trouble relaxing 3  Restless 1  Easily annoyed or irritable 1  Afraid - awful might happen 0  Total GAD 7 Score 9  Anxiety  Difficulty Somewhat difficult       ASSESSMENT/PLAN: Controlled substance agreement filled out today. Alprazolam refilled, patient advised that over the next couple years would like to reduce his dose and hopefully get him off of this medication and explore other possible alternatives for insomnia treatment.  Psychophysiological insomnia    Patient Instructions  Let us know if any questions or concerns!  Plan to follow-up for refill in 6 months for refill, sooner for annual physical!  See below for other information on insomnia.  Take Potter! -Dr. Mervyn SkeetersA.    Other patient instructions printed regarding insomnia, patient advised to review portion on behavioral modifications /sleep hygiene     Visit summary with medication list and pertinent instructions was printed for patient to review. All questions at time of visit were answered - patient instructed to contact office with any additional concerns. ER/RTC precautions were reviewed with the patient. Follow-up plan: Return in about 6 months (around 09/11/2016) for REFILL MEDICATIONS .

## 2016-03-14 NOTE — Patient Instructions (Addendum)
Let us know if any questions or concerns!  Plan to follow-up for refill in 6 months for refill, sooner for annual physical!  See below for other information on insomnia.  Take care! -Dr. Mervyn SkeetersA.   Insomnia Insomnia is a sleep disorder that makes it difficult to fall asleep or to stay asleep. Insomnia can cause tiredness (fatigue), low energy, difficulty concentrating, mood swings, and poor performance at work or school.  There are three different ways to classify insomnia:  Difficulty falling asleep.  Difficulty staying asleep.  Waking up too early in the morning. Any type of insomnia can be long-term (chronic) or short-term (acute). Both are common. Short-term insomnia usually lasts for three months or less. Chronic insomnia occurs at least three times a week for longer than three months. CAUSES  Insomnia may be caused by another condition, situation, or substance, such as:  Anxiety.  Certain medicines.  Gastroesophageal reflux disease (GERD) or other gastrointestinal conditions.  Asthma or other breathing conditions.  Restless legs syndrome, sleep apnea, or other sleep disorders.  Chronic pain.  Menopause. This may include hot flashes.  Stroke.  Abuse of alcohol, tobacco, or illegal drugs.  Depression.  Caffeine.   Neurological disorders, such as Alzheimer disease.  An overactive thyroid (hyperthyroidism). The cause of insomnia may not be known. RISK FACTORS Risk factors for insomnia include:  Gender. Women are more commonly affected than men.  Age. Insomnia is more common as you get older.  Stress. This may involve your professional or personal life.  Income. Insomnia is more common in people with lower income.  Lack of exercise.   Irregular work schedule or night shifts.  Traveling between different time zones. SIGNS AND SYMPTOMS If you have insomnia, trouble falling asleep or trouble staying asleep is the main symptom. This may lead to other symptoms,  such as:  Feeling fatigued.  Feeling nervous about going to sleep.  Not feeling rested in the morning.  Having trouble concentrating.  Feeling irritable, anxious, or depressed. TREATMENT  Treatment for insomnia depends on the cause. If your insomnia is caused by an underlying condition, treatment will focus on addressing the condition. Treatment may also include:   Medicines to help you sleep.  Counseling or therapy.  Lifestyle adjustments. HOME CARE INSTRUCTIONS   Take medicines only as directed by your health care provider.  Keep regular sleeping and waking hours. Avoid naps.  Keep a sleep diary to help you and your health care provider figure out what could be causing your insomnia. Include:   When you sleep.  When you wake up during the night.  How well you sleep.   How rested you feel the next day.  Any side effects of medicines you are taking.  What you eat and drink.   Make your bedroom a comfortable place where it is easy to fall asleep:  Put up shades or special blackout curtains to block light from outside.  Use a white noise machine to block noise.  Keep the temperature cool.   Exercise regularly as directed by your health care provider. Avoid exercising right before bedtime.  Use relaxation techniques to manage stress. Ask your health care provider to suggest some techniques that may work well for you. These may include:  Breathing exercises.  Routines to release muscle tension.  Visualizing peaceful scenes.  Cut back on alcohol, caffeinated beverages, and cigarettes, especially close to bedtime. These can disrupt your sleep.  Do not overeat or eat spicy foods right before bedtime. This  can lead to digestive discomfort that can make it hard for you to sleep.  Limit screen use before bedtime. This includes:  Watching TV.  Using your smartphone, tablet, and computer.  Stick to a routine. This can help you fall asleep faster. Try to do a  quiet activity, brush your teeth, and go to bed at the same time each night.  Get out of bed if you are still awake after 15 minutes of trying to sleep. Keep the lights down, but try reading or doing a quiet activity. When you feel sleepy, go back to bed.  Make sure that you drive carefully. Avoid driving if you feel very sleepy.  Keep all follow-up appointments as directed by your health care provider. This is important. SEEK MEDICAL CARE IF:   You are tired throughout the day or have trouble in your daily routine due to sleepiness.  You continue to have sleep problems or your sleep problems get worse. SEEK IMMEDIATE MEDICAL CARE IF:   You have serious thoughts about hurting yourself or someone else.   This information is not intended to replace advice given to you by your health care provider. Make sure you discuss any questions you have with your health care provider.   Document Released: 04/21/2000 Document Revised: 01/13/2015 Document Reviewed: 01/23/2014 Elsevier Interactive Patient Education [employer]2016 [employer] Inc.

## 2016-08-02 ENCOUNTER — Encounter: Payer: Self-pay | Admitting: Osteopathic Medicine

## 2016-09-05 ENCOUNTER — Encounter: Payer: Self-pay | Admitting: Osteopathic Medicine

## 2016-09-05 ENCOUNTER — Ambulatory Visit (INDEPENDENT_AMBULATORY_CARE_PROVIDER_SITE_OTHER): Payer: Self-pay | Admitting: Osteopathic Medicine

## 2016-09-05 VITALS — BP 118/77 | HR 79 | Ht 70.0 in | Wt 123.0 lb

## 2016-09-05 DIAGNOSIS — Z13220 Encounter for screening for lipoid disorders: Secondary | ICD-10-CM

## 2016-09-05 DIAGNOSIS — R5383 Other fatigue: Secondary | ICD-10-CM | POA: Insufficient documentation

## 2016-09-05 DIAGNOSIS — Z720 Tobacco use: Secondary | ICD-10-CM

## 2016-09-05 DIAGNOSIS — G4700 Insomnia, unspecified: Secondary | ICD-10-CM

## 2016-09-05 MED ORDER — ALPRAZOLAM 2 MG PO TABS: ORAL_TABLET | ORAL | 5 refills | Status: DC

## 2016-09-05 NOTE — Progress Notes (Signed)
HPI: Dylan Potter is a 61 y.o. male  who presents to [facility] today, 09/05/16,  for chief complaint of:  Chief Complaint  Patient presents with  . Follow-up    SLLEP MEDICATION    Insomnia: Chronic, stable. Patient here for follow-up/alprazolam refill. Taking this pretty much every night, has tried to weight himself off in the past without any success. Typically will take half a tablet, often this helps but usually he has to take another half a tablet 2 mg pill to get to sleep. When he is asleep, no nighttime awakenings.  Fatigue: Ongoing. Some daytime somnolence issues. Lack of energy. No significant weight change per patient - lost a bit of weight from this fall, actually up several pounds since this time last year. Associated with widespread body aches. Patient is still smoking.  Currently patient is without insurance, states labs have been normal at employer physicals but we do not have records Overdue for labs/annual   [place] controlled substance database reviewed: Pt consistently getting 30 tablets approximately every month x1 year reviewed.    Past medical, surgical, social and family history reviewed: Past Medical History:  Diagnosis Date  . History of anxiety    Past Surgical History:  Procedure Laterality Date  . DEBRIDEMENT TENNIS ELBOW  2001, 2002  . HERNIA REPAIR  2000   Social History  Substance Use Topics  . Smoking status: Current Every Day Smoker    Packs/day: 1.00    Years: 40.00    Types: Cigarettes  . Smokeless tobacco: Never Used  . Alcohol use No   Family History  Problem Relation Age of Onset  . Hyperlipidemia Mother      Current medication list and allergy/intolerance information reviewed:   Current Outpatient Prescriptions on File Prior to Visit  Medication Sig Dispense Refill  . alprazolam (XANAX) 2 MG tablet Take 1/2 to 1 tablet by mouth at bedtime as needed for sleep.  Follow up needed 09/2016 30 tablet 5    No current facility-administered medications on file prior to visit.    Allergies  Allergen Reactions  . Ambien [Zolpidem Tartrate]     Sleepwalking  . Restoril [Temazepam]     No help with sleep  . Codeine Rash      Review of Systems:  Constitutional: No recent illness, no unintentional weight change, +significant fatigue  Cardiac: No  chest pain  Respiratory:  No  shortness of breath.   Gastrointestinal: No  abdominal pain  Psychiatric: No  concerns with depression, +concerns with anxiety, + insomnia  Exam:  BP 118/77   Pulse 79   Ht  (1.778 m)   Wt 123 lb (55.8 kg)   BMI 17.65 kg/m   Constitutional: VS see above. General Appearance: alert, well-developed, well-nourished, NAD  Eyes: Normal lids and conjunctive, non-icteric sclera  Ears, Nose, Mouth, Throat: MMM, Normal external inspection ears/nares/mouth/lips/gums.  Neck: No masses, trachea midline.   Respiratory: Normal respiratory effort. no wheeze, no rhonchi, no rales. Diminished breath sounds bilaterally  Cardiovascular: S1/S2 normal, no murmur, no rub/gallop auscultated. RRR.   Musculoskeletal: Gait normal. Symmetric and independent movement of all extremities  Neurological: Normal balance/coordination. No tremor.  Skin: warm, dry, intact.   Psychiatric: Normal judgment/insight. Normal mood and affect. Oriented x3.    Depression screen [facility] 2/9 09/05/2016 03/14/2016  Decreased Interest 0 1  Down, Depressed, Hopeless 0 1  PHQ - 2 Score 0 2  Altered sleeping - 3  Tired, decreased energy -  3  Change in appetite - 0  Feeling bad or failure about yourself  - 1  Trouble concentrating - 1  Moving slowly or fidgety/restless - 1  Suicidal thoughts - 0  PHQ-9 Score - 11    GAD 7 : Generalized Anxiety Score 03/14/2016  Nervous, Anxious, on Edge 2  Control/stop worrying 1  Worry too much - different things 1  Trouble relaxing 3  Restless 1  Easily annoyed or irritable 1  Afraid - awful might  happen 0  Total GAD 7 Score 9  Anxiety Difficulty Somewhat difficult       ASSESSMENT/PLAN: Controlled substance agreement filled out last viait. Alprazolam refilled, patient advised that over the next couple years would like to reduce his dose and hopefully get him off of this medication and explore other possible alternatives for insomnia treatment. Other A/P as below. Ideally would like testosterone levels and sleep study but patient lacks insurance so this will be a challenge  Other fatigue - Need labs to workup possible organic cause. Consider fibromyalgia/chronic fatigue syndrome, possible comorbid depression/anxiety. Advised quit smoking - Plan: COMPLETE METABOLIC PANEL WITH GFR, Lipid panel, TSH, Hemoglobin A1c, CBC  Lipid screening - Plan: Lipid panel  Insomnia, unspecified type - Benzodiazepine dependence at this point. I think okay to continue with eventual goal of decreasing these medications - Plan: COMPLETE METABOLIC PANEL WITH GFR, TSH, CBC  Tobacco abuse disorder - Advise discontinue use for overall health and reduction of cardiovascular risk, likely is affecting his energy levels as well. Possible COPD    Visit summary with medication list and pertinent instructions was printed for patient to review. All questions at time of visit were answered - patient instructed to contact office with any additional concerns. ER/RTC precautions were reviewed with the patient. Follow-up plan: Return in about 6 months (around 03/08/2017) for continuation of Alprazolam, sooner as needed for fatigue depending on labs.

## 2016-09-06 LAB — CBC
HCT: 43.1 % (ref 38.5–50.0)
HEMOGLOBIN: 14.7 g/dL (ref 13.2–17.1)
MCH: 32.2 pg (ref 27.0–33.0)
MCHC: 34.1 g/dL (ref 32.0–36.0)
MCV: 94.3 fL (ref 80.0–100.0)
MPV: 9.7 fL (ref 7.5–12.5)
Platelets: 214 10*3/uL (ref 140–400)
RBC: 4.57 MIL/uL (ref 4.20–5.80)
RDW: 14.1 % (ref 11.0–15.0)
WBC: 7.8 10*3/uL (ref 3.8–10.8)

## 2016-09-06 LAB — LIPID PANEL
CHOLESTEROL: 147 mg/dL (ref <200)
HDL: 35 mg/dL — ABNORMAL LOW (ref >40)
LDL Cholesterol: 72 mg/dL (ref <100)
Total CHOL/HDL Ratio: 4.2 Ratio (ref <5.0)
Triglycerides: 199 mg/dL — ABNORMAL HIGH (ref <150)
VLDL: 40 mg/dL — ABNORMAL HIGH (ref <30)

## 2016-09-06 LAB — COMPLETE METABOLIC PANEL WITH GFR
ALT: 12 U/L (ref 9–46)
AST: 24 U/L (ref 10–35)
Albumin: 4 g/dL (ref 3.6–5.1)
Alkaline Phosphatase: 53 U/L (ref 40–115)
BILIRUBIN TOTAL: 0.3 mg/dL (ref 0.2–1.2)
BUN: 23 mg/dL (ref 7–25)
CALCIUM: 9.3 mg/dL (ref 8.6–10.3)
CO2: 24 mmol/L (ref 20–31)
CREATININE: 1.15 mg/dL (ref 0.70–1.33)
Chloride: 110 mmol/L (ref 98–110)
GFR, Est African American: 81 mL/min (ref >=60)
GFR, Est Non African American: 70 mL/min (ref >=60)
Glucose, Bld: 91 mg/dL (ref 65–99)
Potassium: 3.9 mmol/L (ref 3.5–5.3)
Sodium: 145 mmol/L (ref 135–146)
TOTAL PROTEIN: 6.4 g/dL (ref 6.1–8.1)

## 2016-09-06 LAB — HEMOGLOBIN A1C
Hgb A1c MFr Bld: 5 % (ref <5.7)
Mean Plasma Glucose: 97 mg/dL

## 2016-09-06 LAB — TSH: TSH: 2.07 mIU/L (ref 0.40–4.50)

## 2016-11-19 ENCOUNTER — Encounter: Payer: Self-pay | Admitting: Osteopathic Medicine

## 2016-11-20 MED ORDER — TAMSULOSIN HCL 0.4 MG PO CAPS: 0.4000 mg | ORAL_CAPSULE | Freq: Every day | ORAL | 3 refills | Status: DC

## 2017-02-19 ENCOUNTER — Encounter: Payer: Self-pay | Admitting: Osteopathic Medicine

## 2017-02-19 ENCOUNTER — Ambulatory Visit (INDEPENDENT_AMBULATORY_CARE_PROVIDER_SITE_OTHER): Payer: BLUE CROSS/BLUE SHIELD | Admitting: Osteopathic Medicine

## 2017-02-19 VITALS — BP 136/86 | HR 64 | Ht 69.0 in | Wt 126.0 lb

## 2017-02-19 DIAGNOSIS — G4700 Insomnia, unspecified: Secondary | ICD-10-CM

## 2017-02-19 DIAGNOSIS — R5383 Other fatigue: Secondary | ICD-10-CM

## 2017-02-19 DIAGNOSIS — J302 Other seasonal allergic rhinitis: Secondary | ICD-10-CM

## 2017-02-19 MED ORDER — SUVOREXANT 10 MG PO TABS: 10.0000 mg | ORAL_TABLET | Freq: Every day | ORAL | 0 refills | Status: DC

## 2017-02-19 MED ORDER — FLUTICASONE PROPIONATE 50 MCG/ACT NA SUSP: 2.0000 | Freq: Every day | NASAL | 6 refills | Status: DC

## 2017-02-19 MED ORDER — ALPRAZOLAM 1 MG PO TABS: 1.5000 mg | ORAL_TABLET | Freq: Every evening | ORAL | 1 refills | Status: DC | PRN

## 2017-02-19 MED ORDER — SUVOREXANT 5 MG PO TABS: 5.0000 mg | ORAL_TABLET | Freq: Every day | ORAL | 0 refills | Status: DC

## 2017-02-19 NOTE — Progress Notes (Signed)
HPI: Dylan Potter is a 61 y.o. male  who presents to [facility] today, 02/19/17,  for chief complaint of:  Chief Complaint  Patient presents with  . Follow-up    Insomnia: Chronic, stable. Patient here for follow-up/alprazolam refill. Taking this pretty much every night, has tried to wean himself off in the past without any success. Typically will take half a tablet, often this helps but usually he has to take another half a tablet 2 mg pill to get to sleep. When he is asleep, no nighttime awakenings But he is finding that he is only getting about 4-5 hours of sleep and then waking up, not being able to get back to sleep.  Fatigue: Ongoing. Some daytime somnolence issues. Lack of energy. Associated with widespread body aches. Patient is still smoking. Would like testosterone levels checked.   [place] controlled substance database reviewed: Pt consistently getting 30 tablets approximately every month x1 year reviewed.    Past medical, surgical, social and family history reviewed: Past Medical History:  Diagnosis Date  . History of anxiety    Past Surgical History:  Procedure Laterality Date  . DEBRIDEMENT TENNIS ELBOW  2001, 2002  . HERNIA REPAIR  2000   Social History  Substance Use Topics  . Smoking status: Current Every Day Smoker    Packs/day: 1.00    Years: 40.00    Types: Cigarettes  . Smokeless tobacco: Never Used  . Alcohol use No   Family History  Problem Relation Age of Onset  . Hyperlipidemia Mother      Current medication list and allergy/intolerance information reviewed:   Current Outpatient Prescriptions on File Prior to Visit  Medication Sig Dispense Refill  . alprazolam (XANAX) 2 MG tablet Take 1/2 to 1 tablet by mouth at bedtime as needed for sleep. #30 for thirty days 30 tablet 5  . tamsulosin (FLOMAX) 0.4 MG CAPS capsule Take 1 capsule (0.4 mg total) by mouth daily. 90 capsule 3   No current facility-administered  medications on file prior to visit.    Allergies  Allergen Reactions  . Ambien [Zolpidem Tartrate]     Sleepwalking  . Restoril [Temazepam]     No help with sleep  . Codeine Rash      Review of Systems:  Constitutional: No recent illness, no unintentional weight change, +significant fatigue  Cardiac: No  chest pain  Respiratory:  No  shortness of breath.   Gastrointestinal: No  abdominal pain  Psychiatric: No  concerns with depression, +concerns with anxiety, + insomnia  Exam:  BP 136/86   Pulse 64   Ht  (1.753 m)   Wt 126 lb (57.2 kg)   BMI 18.61 kg/m   Constitutional: VS see above. General Appearance: alert, well-developed, well-nourished, NAD  Eyes: Normal lids and conjunctive, non-icteric sclera  Ears, Nose, Mouth, Throat: MMM, Normal external inspection ears/nares/mouth/lips/gums.  Neck: No masses, trachea midline.   Respiratory: Normal respiratory effort. no wheeze, no rhonchi, no rales. Diminished breath sounds bilaterally  Cardiovascular: S1/S2 normal, no murmur, no rub/gallop auscultated. RRR.   Musculoskeletal: Gait normal. Symmetric and independent movement of all extremities  Neurological: Normal balance/coordination. No tremor.  Skin: warm, dry, intact.   Psychiatric: Normal judgment/insight. Normal mood and affect. Oriented x3.    Depression screen [facility] 2/9 02/19/2017 09/05/2016 03/14/2016  Decreased Interest 0 0 1  Down, Depressed, Hopeless 0 0 1  PHQ - 2 Score 0 0 2  Altered sleeping 0 - 3  Tired, decreased energy 0 - 3  Change in appetite 0 - 0  Feeling bad or failure about yourself  0 - 1  Trouble concentrating 0 - 1  Moving slowly or fidgety/restless 0 - 1  Suicidal thoughts 0 - 0  PHQ-9 Score 0 - 11    GAD 7 : Generalized Anxiety Score 03/14/2016  Nervous, Anxious, on Edge 2  Control/stop worrying 1  Worry too much - different things 1  Trouble relaxing 3  Restless 1  Easily annoyed or irritable 1  Afraid - awful might  happen 0  Total GAD 7 Score 9  Anxiety Difficulty Somewhat difficult       ASSESSMENT/PLAN: Since not fully sleeping through the night and likely dependent on benzodiazepines, we discussed continuation of this medicine, which of course is short acting, versus transition to alternative sleep aid such as Belsomra. Patient is agreeable to try something else. I advised that slowly tapering down on alprazolam while initiating low dose of alternative sleep aid may be the way to go here. Caution however since cumulative effects of sedating medications may certainly be a problem. Patient is willing to go ahead with trial of decreased alprazolam, see patient instructions.  Insomnia, unspecified type - Plan: Suvorexant (BELSOMRA) 5 MG TABS  Fatigue, unspecified type - Plan: Testosterone  Seasonal allergies    Patient Instructions  Plan:  Insomnia:  Try tapering off slowly on the Alprazolam When done with the  pills you have, fill Rx for 1.5 mg to take every evening Take just this for a week or so,  Message me and let me know how you're doing on lower dose Then can start the Belsomra at low dose 5 mg to start, then up to 10 mg  As we go down on the Alprazolam, we can increase the Belsomra  If Belsomra Rx is crazy expensive, let me know!   Check Testosterone 7:30 AM at your convenience   Even fairly severe allergies can typically be treated with over-the-counter medications, I usually will recommend a combination of steroid nasal spray such as Flonase or Nasonex or either one of their generics, in combination with an antihistamine such as Allegra, Zyrtec, or Claritin or one of the generics. If the combination isn't helping, let me know and I can send in a prescription antihistamine nasal spray to use with the oral medications as well.       Visit summary with medication list and pertinent instructions was printed for patient to review. All questions at time of visit were answered -  patient instructed to contact office with any additional concerns. ER/RTC precautions were reviewed with the patient. Follow-up plan: Return in about 3 months (around 05/22/2017) for recheck insomnia .

## 2017-02-19 NOTE — Patient Instructions (Addendum)
Plan:  Insomnia:  Try tapering off slowly on the Alprazolam When done with the  pills you have, fill Rx for 1.5 mg to take every evening Take just this for a week or so,  Message me and let me know how you're doing on lower dose Then can start the Belsomra at low dose 5 mg to start, then up to 10 mg  As we go down on the Alprazolam, we can increase the Belsomra  If Belsomra Rx is crazy expensive, let me know!   Check Testosterone 7:30 AM at your convenience   Even fairly severe allergies can typically be treated with over-the-counter medications, I usually will recommend a combination of steroid nasal spray such as Flonase or Nasonex or either one of their generics, in combination with an antihistamine such as Allegra, Zyrtec, or Claritin or one of the generics. If the combination isn't helping, let me know and I can send in a prescription antihistamine nasal spray to use with the oral medications as well.

## 2017-03-08 ENCOUNTER — Ambulatory Visit: Payer: Self-pay | Admitting: Osteopathic Medicine

## 2017-03-11 ENCOUNTER — Encounter: Payer: Self-pay | Admitting: Osteopathic Medicine

## 2017-03-12 MED ORDER — TRAZODONE HCL 50 MG PO TABS: 50.0000 mg | ORAL_TABLET | Freq: Every evening | ORAL | 3 refills | Status: DC | PRN

## 2017-03-29 ENCOUNTER — Encounter: Payer: Self-pay | Admitting: Osteopathic Medicine

## 2017-04-03 ENCOUNTER — Other Ambulatory Visit: Payer: Self-pay | Admitting: Osteopathic Medicine

## 2017-04-03 MED ORDER — TRAZODONE HCL 50 MG PO TABS: 50.0000 mg | ORAL_TABLET | Freq: Every evening | ORAL | 1 refills | Status: DC | PRN

## 2017-04-03 NOTE — Progress Notes (Signed)
See email--

## 2017-04-22 ENCOUNTER — Encounter: Payer: Self-pay | Admitting: Osteopathic Medicine

## 2017-04-23 MED ORDER — TRAZODONE HCL 50 MG PO TABS: 50.0000 mg | ORAL_TABLET | Freq: Every evening | ORAL | 1 refills | Status: DC | PRN

## 2017-04-23 MED ORDER — ALPRAZOLAM 1 MG PO TABS: 1.5000 mg | ORAL_TABLET | Freq: Every evening | ORAL | 0 refills | Status: DC | PRN

## 2017-04-23 MED ORDER — TAMSULOSIN HCL 0.4 MG PO CAPS: 0.4000 mg | ORAL_CAPSULE | Freq: Every day | ORAL | 3 refills | Status: DC

## 2017-05-02 ENCOUNTER — Encounter: Payer: Self-pay | Admitting: Osteopathic Medicine

## 2017-05-02 MED ORDER — OMEPRAZOLE 40 MG PO CPDR: 40.0000 mg | DELAYED_RELEASE_CAPSULE | Freq: Every day | ORAL | 1 refills | Status: DC

## 2017-05-05 ENCOUNTER — Other Ambulatory Visit: Payer: Self-pay | Admitting: Osteopathic Medicine

## 2017-05-07 ENCOUNTER — Encounter: Payer: Self-pay | Admitting: Osteopathic Medicine

## 2017-05-07 NOTE — Telephone Encounter (Signed)
Called in verbal [facility] on [facility], Pt advised.

## 2017-05-22 ENCOUNTER — Ambulatory Visit (INDEPENDENT_AMBULATORY_CARE_PROVIDER_SITE_OTHER): Payer: BLUE CROSS/BLUE SHIELD | Admitting: Osteopathic Medicine

## 2017-05-22 ENCOUNTER — Encounter: Payer: Self-pay | Admitting: Osteopathic Medicine

## 2017-05-22 VITALS — BP 118/72 | HR 64 | Temp 98.4°F | Wt 124.1 lb

## 2017-05-22 DIAGNOSIS — F17200 Nicotine dependence, unspecified, uncomplicated: Secondary | ICD-10-CM

## 2017-05-22 DIAGNOSIS — R5383 Other fatigue: Secondary | ICD-10-CM

## 2017-05-22 DIAGNOSIS — Z0000 Encounter for general adult medical examination without abnormal findings: Secondary | ICD-10-CM

## 2017-05-22 DIAGNOSIS — G4700 Insomnia, unspecified: Secondary | ICD-10-CM

## 2017-05-22 MED ORDER — VARENICLINE TARTRATE 1 MG PO TABS: 1.0000 mg | ORAL_TABLET | Freq: Two times a day (BID) | ORAL | 0 refills | Status: DC

## 2017-05-22 MED ORDER — TRAZODONE HCL 100 MG PO TABS: 100.0000 mg | ORAL_TABLET | Freq: Every day | ORAL | 3 refills | Status: DC

## 2017-05-22 MED ORDER — VARENICLINE TARTRATE 0.5 MG X 11 & 1 MG X 42 PO MISC: ORAL | 0 refills | Status: DC

## 2017-05-22 NOTE — Patient Instructions (Signed)
Varenicline oral tablets What is this medicine? VARENICLINE (var EN i kleen) is used to help people quit smoking. It can reduce the symptoms caused by stopping smoking. It is used with a patient support program recommended by your physician. This medicine may be used for other purposes; ask your health care provider or pharmacist if you have questions. COMMON BRAND NAME(S): Chantix What should I tell my health care provider before I take this medicine? They need to know if you have any of these conditions: -bipolar disorder, depression, schizophrenia or other mental illness -heart disease -if you often drink alcohol -kidney disease -peripheral vascular disease -seizures -stroke -suicidal thoughts, plans, or attempt; a previous suicide attempt by you or a family member -an unusual or allergic reaction to varenicline, other medicines, foods, dyes, or preservatives -pregnant or trying to get pregnant -breast-feeding How should I use this medicine? Take this medicine by mouth after eating. Take with a full glass of water. Follow the directions on the prescription label. Take your doses at regular intervals. Do not take your medicine more often than directed. There are 3 ways you can use this medicine to help you quit smoking; talk to your health care professional to decide which plan is right for you: 1) you can choose a quit date and start this medicine 1 week before the quit date, or, 2) you can start taking this medicine before you choose a quit date, and then pick a quit date between day 8 and 35 days of treatment, or, 3) if you are not sure that you are able or willing to quit smoking right away, start taking this medicine and slowly decrease the amount you smoke as directed by your health care professional with the goal of being cigarette-free by week 12 of treatment. Stick to your plan; ask about support groups or other ways to help you remain cigarette-free. If you are motivated to quit  smoking and did not succeed during a previous attempt with this medicine for reasons other than side effects, or if you returned to smoking after this treatment, speak with your health care professional about whether another course of this medicine may be right for you. A special MedGuide will be given to you by the pharmacist with each prescription and refill. Be sure to read this information carefully each time. Talk to your pediatrician regarding the use of this medicine in children. This medicine is not approved for use in children. Overdosage: If you think you have taken too much of this medicine contact a poison control center or emergency room at once. NOTE: This medicine is only for you. Do not share this medicine with others. What if I miss a dose? If you miss a dose, take it as soon as you can. If it is almost time for your next dose, take only that dose. Do not take double or extra doses. What may interact with this medicine? -alcohol or any product that contains alcohol -insulin -other stop smoking aids -theophylline -warfarin This list may not describe all possible interactions. Give your health care provider a list of all the medicines, herbs, non-prescription drugs, or dietary supplements you use. Also tell them if you smoke, drink alcohol, or use illegal drugs. Some items may interact with your medicine. What should I watch for while using this medicine? Visit your doctor or health care professional for regular check ups. Ask for ongoing advice and encouragement from your doctor or healthcare professional, friends, and family to help you quit. If   you smoke while on this medication, quit again Your mouth may get dry. Chewing sugarless gum or sucking hard candy, and drinking plenty of water may help. Contact your doctor if the problem does not go away or is severe. You may get drowsy or dizzy. Do not drive, use machinery, or do anything that needs mental alertness until you know how  this medicine affects you. Do not stand or sit up quickly, especially if you are an older patient. This reduces the risk of dizzy or fainting spells. Sleepwalking can happen during treatment with this medicine, and can sometimes lead to behavior that is harmful to you, other people, or property. Stop taking this medicine and tell your doctor if you start sleepwalking or have other unusual sleep-related activity. Decrease the amount of alcoholic beverages that you drink during treatment with this medicine until you know if this medicine affects your ability to tolerate alcohol. Some people have experienced increased drunkenness (intoxication), unusual or sometimes aggressive behavior, or no memory of things that have happened (amnesia) during treatment with this medicine. The use of this medicine may increase the chance of suicidal thoughts or actions. Pay special attention to how you are responding while on this medicine. Any worsening of mood, or thoughts of suicide or dying should be reported to your health care professional right away. What side effects may I notice from receiving this medicine? Side effects that you should report to your doctor or health care professional as soon as possible: -allergic reactions like skin rash, itching or hives, swelling of the face, lips, tongue, or throat -acting aggressive, being angry or violent, or acting on dangerous impulses -breathing problems -changes in vision -chest pain or chest tightness -confusion, trouble speaking or understanding -new or worsening depression, anxiety, or panic attacks -extreme increase in activity and talking (mania) -fast, irregular heartbeat -feeling faint or lightheaded, falls -fever -pain in legs when walking -problems with balance, talking, walking -redness, blistering, peeling or loosening of the skin, including inside the mouth -ringing in ears -seeing or hearing things that aren't there  (hallucinations) -seizures -sleepwalking -sudden numbness or weakness of the face, arm or leg -thoughts about suicide or dying, or attempts to commit suicide -trouble passing urine or change in the amount of urine -unusual bleeding or bruising -unusually weak or tired Side effects that usually do not require medical attention (report to your doctor or health care professional if they continue or are bothersome): -constipation -headache -nausea, vomiting -strange dreams -stomach gas -trouble sleeping This list may not describe all possible side effects. Call your doctor for medical advice about side effects. You may report side effects to FDA at 1-800-FDA-1088. Where should I keep my medicine? Keep out of the reach of children. Store at room temperature between 15 and 30 degrees C (59 and 86 degrees F). Throw away any unused medicine after the expiration date. NOTE: This sheet is a summary. It may not cover all possible information. If you have questions about this medicine, talk to your doctor, pharmacist, or health care provider.  2018 Elsevier/Gold Standard (2015-01-07 16:14:23)  

## 2017-05-22 NOTE — Progress Notes (Signed)
HPI: Dylan Potter is a 62 y.o. male  who [facility], 05[facility]22/17,  for chief complaint of:  Chief Complaint  Patient presents with  . Follow-up    Insomnia: Chronic. For years has been taking alprazolam pretty much every night, has tried to wean himself off in the past without any success. Previously been on Ambien, which causes him sleepwalking/delirium issues. Temazepam was also not successful, made no difference. Was recently taking alprazolam 2 mg tablets typically twice per night, as soon as the medication wore off he would typically have to take an extra in the middle of the night.. We have tried Belsomra to give us some help with sleep maintenance in addition to weaning off benzodiazepine sleep aid - decreasing alprazolam to 1 mg tablets, he is taking 1-1/2 tablets / 1.5 mg dose. Cost of the Belsomra was unfortunately an issue. I sent in trazodone. He is currently taking 100 milligrams of the trazodone.   Fatigue: Ongoing. Some daytime somnolence issues. Lack of energy. Associated with widespread body aches. Patient is still smoking. Would like testosterone levels checked - this was ordered last visit but doesn't look like he's gotten this done yet. Otherwise, most recent labs on file 09/05/2016, no major concerns except elevated triglyceride levels  [place] controlled substance database reviewed: No concerns   Past medical, surgical, social and family history reviewed: Past Medical History:  Diagnosis Date  . History of anxiety    Past Surgical History:  Procedure Laterality Date  . DEBRIDEMENT TENNIS ELBOW  2001, 2002  . HERNIA REPAIR  2000   Social History   Tobacco Use  . Smoking status: Current Every Day Smoker    Packs/day: 1.00    Years: 40.00    Pack years: 40.00    Types: Cigarettes  . Smokeless tobacco: Never Used  Substance Use Topics  . Alcohol use: No   Family History  Problem Relation Age of Onset  .  Hyperlipidemia Mother      Current medication list and allergy/intolerance information reviewed:   Current Outpatient Medications on File Prior to Visit  Medication Sig Dispense Refill  . ALPRAZolam (XANAX) 1 MG tablet Take 1.5 tablets (1.5 mg total) by mouth at bedtime as needed for sleep. #135 for ninety days 135 tablet 0  . fluticasone (FLONASE) 50 MCG/ACT nasal spray Place 2 sprays into both nostrils daily. 16 g 6  . omeprazole (PRILOSEC) 40 MG capsule Take 1 capsule (40 mg total) by mouth daily. 90 capsule 1  . Suvorexant (BELSOMRA) 10 MG TABS Take 10 mg by mouth at bedtime. 30 tablet 0  . tamsulosin (FLOMAX) 0.4 MG CAPS capsule Take 1 capsule (0.4 mg total) by mouth daily. 90 capsule 3  . traZODone (DESYREL) 50 MG tablet Take 1-2 tablets (50-100 mg total) by mouth at bedtime as needed for sleep. 180 tablet 1   No current facility-administered medications on file prior to visit.    Allergies  Allergen Reactions  . Ambien [Zolpidem Tartrate]     Sleepwalking  . Restoril [Temazepam]     No help with sleep  . Codeine Rash      Review of Systems:  Constitutional: No recent illness, no unintentional weight change, +significant fatigue  Cardiac: No  chest pain  Respiratory:  No  shortness of breath.   Gastrointestinal: No  abdominal pain  Psychiatric: No  concerns with depression, +concerns with anxiety, + insomnia  Exam:  BP 118/72   Pulse 64  Temp 98.4 F (36.9 C) (Oral)   Wt 124 lb 1.3 oz (56.3 kg)   BMI 18.32 kg/m   Constitutional: VS see above. General Appearance: alert, well-developed, well-nourished, NAD  Eyes: Normal lids and conjunctive, non-icteric sclera  Ears, Nose, Mouth, Throat: MMM, Normal external inspection ears/nares/mouth/lips/gums.  Neck: No masses, trachea midline.   Respiratory: Normal respiratory effort. no wheeze, no rhonchi, no rales. Diminished breath sounds bilaterally  Cardiovascular: S1/S2 normal, no murmur, no rub/gallop  auscultated. RRR.   Musculoskeletal: Gait normal. Symmetric and independent movement of all extremities  Neurological: Normal balance/coordination. No tremor.  Skin: warm, dry, intact.   Psychiatric: Normal judgment/insight. Normal mood and affect. Oriented x3.    Depression screen [facility] 2/9 02/19/2017 09/05/2016 03/14/2016  Decreased Interest 0 0 1  Down, Depressed, Hopeless 0 0 1  PHQ - 2 Score 0 0 2  Altered sleeping 0 - 3  Tired, decreased energy 0 - 3  Change in appetite 0 - 0  Feeling bad or failure about yourself  0 - 1  Trouble concentrating 0 - 1  Moving slowly or fidgety/restless 0 - 1  Suicidal thoughts 0 - 0  PHQ-9 Score 0 - 11    GAD 7 : Generalized Anxiety Score 03/14/2016  Nervous, Anxious, on Edge 2  Control/stop worrying 1  Worry too much - different things 1  Trouble relaxing 3  Restless 1  Easily annoyed or irritable 1  Afraid - awful might happen 0  Total GAD 7 Score 9  Anxiety Difficulty Somewhat difficult       ASSESSMENT/PLAN:   Overall, patient is fairly happy with current regimen of alprazolam pressed trazodone. He tried a higher dose of trazodone but this caused some grogginess in the morning. Given that he's tried basically everything else, he states that he thinks he just needs to cut down on some of the anxiety in his work/personal life. Offered referral to sleep specialist, declines at this time.   I'm okay to go ahead and continue current medication regimen, okay refills for 6 months from now  Insomnia, unspecified type - Plan: traZODone (DESYREL) 100 MG tablet  Fatigue, unspecified type - Plan: CBC, COMPLETE METABOLIC PANEL WITH GFR, TSH, VITAMIN D 25 Hydroxy (Vit-D Deficiency, Fractures), Testosterone  Tobacco dependence - Plan: varenicline (CHANTIX STARTING MONTH PAK) 0.5 MG X 11 & 1 MG X 42 tablet, varenicline (CHANTIX CONTINUING MONTH PAK) 1 MG tablet  Annual physical exam - labs for future visit - not billed/performed today  - Plan:  CBC, COMPLETE METABOLIC PANEL WITH GFR, Lipid panel, TSH, PSA, Total with Reflex to PSA, Free, VITAMIN D 25 Hydroxy (Vit-D Deficiency, Fractures)       Visit summary with medication list and pertinent instructions was printed for patient to review. All questions at time of visit were answered - patient instructed to contact office with any additional concerns. ER/RTC precautions were reviewed with the patient. Follow-up plan: Return in about 6 months (around 11/19/2017) for ANNUAL PHYSICAL, recheck insomnis, see me sooner if needed .

## 2017-06-30 ENCOUNTER — Other Ambulatory Visit: Payer: Self-pay | Admitting: Osteopathic Medicine

## 2017-07-05 ENCOUNTER — Encounter: Payer: Self-pay | Admitting: Osteopathic Medicine

## 2017-07-19 ENCOUNTER — Other Ambulatory Visit: Payer: Self-pay | Admitting: Osteopathic Medicine

## 2017-07-19 DIAGNOSIS — F17200 Nicotine dependence, unspecified, uncomplicated: Secondary | ICD-10-CM

## 2017-07-30 ENCOUNTER — Other Ambulatory Visit: Payer: Self-pay | Admitting: Osteopathic Medicine

## 2017-07-31 NOTE — Telephone Encounter (Signed)
Left a detailed vm msg for patient regarding [facility]. Call back information provided.

## 2017-07-31 NOTE — Telephone Encounter (Signed)
[facility]. Thanks.

## 2017-08-10 ENCOUNTER — Other Ambulatory Visit: Payer: Self-pay | Admitting: Osteopathic Medicine

## 2017-08-10 DIAGNOSIS — F17200 Nicotine dependence, unspecified, uncomplicated: Secondary | ICD-10-CM

## 2017-09-03 LAB — CBC
HCT: 42.4 % (ref 38.5–50.0)
HEMOGLOBIN: 14.4 g/dL (ref 13.2–17.1)
MCH: 31.2 pg (ref 27.0–33.0)
MCHC: 34 g/dL (ref 32.0–36.0)
MCV: 91.8 fL (ref 80.0–100.0)
MPV: 9.7 fL (ref 7.5–12.5)
Platelets: 268 10*3/uL (ref 140–400)
RBC: 4.62 10*6/uL (ref 4.20–5.80)
RDW: 13.4 % (ref 11.0–15.0)
WBC: 11.8 10*3/uL — ABNORMAL HIGH (ref 3.8–10.8)

## 2017-09-03 LAB — COMPLETE METABOLIC PANEL WITH GFR
AG RATIO: 1.6 (calc) (ref 1.0–2.5)
ALBUMIN MSPROF: 4 g/dL (ref 3.6–5.1)
ALT: 15 U/L (ref 9–46)
AST: 17 U/L (ref 10–35)
Alkaline phosphatase (APISO): 75 U/L (ref 40–115)
BILIRUBIN TOTAL: 0.3 mg/dL (ref 0.2–1.2)
BUN / CREAT RATIO: 26 (calc) — AB (ref 6–22)
BUN: 26 mg/dL — AB (ref 7–25)
CALCIUM: 9.5 mg/dL (ref 8.6–10.3)
CHLORIDE: 108 mmol/L (ref 98–110)
CO2: 27 mmol/L (ref 20–32)
Creat: 0.99 mg/dL (ref 0.70–1.33)
GFR, EST AFRICAN AMERICAN: 96 mL/min/{1.73_m2} (ref >=60)
GFR, EST NON AFRICAN AMERICAN: 83 mL/min/{1.73_m2} (ref >=60)
Globulin: 2.5 g/dL (calc) (ref 1.9–3.7)
Glucose, Bld: 105 mg/dL — ABNORMAL HIGH (ref 65–99)
Potassium: 3.9 mmol/L (ref 3.5–5.3)
Sodium: 143 mmol/L (ref 135–146)
Total Protein: 6.5 g/dL (ref 6.1–8.1)

## 2017-09-03 LAB — LIPID PANEL
Cholesterol: 135 mg/dL (ref <200)
HDL: 38 mg/dL — ABNORMAL LOW (ref >40)
LDL CHOLESTEROL (CALC): 74 mg/dL
NON-HDL CHOLESTEROL (CALC): 97 mg/dL (ref <130)
TRIGLYCERIDES: 142 mg/dL (ref <150)
Total CHOL/HDL Ratio: 3.6 (calc) (ref <5.0)

## 2017-09-04 LAB — TESTOSTERONE: Testosterone: 937 ng/dL — ABNORMAL HIGH (ref 250–827)

## 2017-09-04 LAB — PSA, TOTAL WITH REFLEX TO PSA, FREE: PSA, Total: 2.4 ng/mL (ref <=4.0)

## 2017-09-04 LAB — VITAMIN D 25 HYDROXY (VIT D DEFICIENCY, FRACTURES): Vit D, 25-Hydroxy: 28 ng/mL — ABNORMAL LOW (ref 30–100)

## 2017-09-04 LAB — TSH: TSH: 1.91 mIU/L (ref 0.40–4.50)

## 2017-09-05 LAB — HEMOGLOBIN A1C W/OUT EAG: Hgb A1c MFr Bld: 5.1 % of total Hgb (ref <5.7)

## 2017-09-09 ENCOUNTER — Encounter: Payer: Self-pay | Admitting: Emergency Medicine

## 2017-09-09 ENCOUNTER — Emergency Department
Admission: EM | Admit: 2017-09-09 | Discharge: 2017-09-09 | Disposition: A | Discharge status: Home / Self Care | Payer: BLUE CROSS/BLUE SHIELD | Source: Home / Self Care | Attending: Family Medicine | Admitting: Family Medicine

## 2017-09-09 ENCOUNTER — Emergency Department (INDEPENDENT_AMBULATORY_CARE_PROVIDER_SITE_OTHER): Payer: BLUE CROSS/BLUE SHIELD

## 2017-09-09 DIAGNOSIS — R042 Hemoptysis: Secondary | ICD-10-CM | POA: Diagnosis not present

## 2017-09-09 DIAGNOSIS — R05 Cough: Secondary | ICD-10-CM

## 2017-09-09 DIAGNOSIS — R918 Other nonspecific abnormal finding of lung field: Secondary | ICD-10-CM

## 2017-09-09 DIAGNOSIS — R509 Fever, unspecified: Secondary | ICD-10-CM

## 2017-09-09 DIAGNOSIS — F17200 Nicotine dependence, unspecified, uncomplicated: Secondary | ICD-10-CM

## 2017-09-09 MED ORDER — AZITHROMYCIN 250 MG PO TABS: 250.0000 mg | ORAL_TABLET | Freq: Every day | ORAL | 0 refills | Status: DC

## 2017-09-09 MED ORDER — BENZONATATE 100 MG PO CAPS: 100.0000 mg | ORAL_CAPSULE | Freq: Three times a day (TID) | ORAL | 0 refills | Status: DC

## 2017-09-09 NOTE — ED Provider Notes (Signed)
[facility]    CSN: [id number] Arrival date & time: 09/09/17  1152     History   Chief Complaint Chief Complaint  Patient presents with  . Cough    HPI Dylan Potter is a 62 y.o. male.   HPI  Dylan Potter is a 62 y.o. male presenting to UC with c/o productive cough for 4 days with blood sputum, associated fever Tmax 101*F at night, no fever during the day.  He reports coughing up bright red blood at times, about a quarter to teaspoon sized amount each time.  He has taken Robitussin at night for his cough with mild relief.  Denies n/v/d. Denies known sick contacts. Denies chest pain or SOB. He does smoke 1ppd cigarettes.  No recent travel.    Past Medical History:  Diagnosis Date  . History of anxiety     Patient Active Problem List   Diagnosis Date Noted  . Tobacco abuse disorder 09/05/2016  . Other fatigue 09/05/2016  . Hematuria 12/19/2013  . Insomnia 07/01/2013  . Esophageal stricture 03/31/2013  . Generalized anxiety disorder 12/27/2012  . History of anxiety     Past Surgical History:  Procedure Laterality Date  . DEBRIDEMENT TENNIS ELBOW  2001, 2002  . HERNIA REPAIR  2000       Home Medications    Prior to Admission medications   Medication Sig Start Date End Date Taking? Authorizing Provider  ALPRAZolam Prudy Feeler) 1 MG tablet TAKE 1 & 1/2 TABLET AT BEDTIME AS NEEDED FOR SLEEP 07/31/17   Sunnie Nielsen, DO  azithromycin (ZITHROMAX) 250 MG tablet Take 1 tablet (250 mg total) by mouth daily. Take first 2 tablets together, then 1 every day until finished. 09/09/17   Lurene Shadow, PA-C  benzonatate (TESSALON) 100 MG capsule Take 1-2 capsules (100-200 mg total) by mouth every 8 (eight) hours. 09/09/17   Lurene Shadow, PA-C  CHANTIX 1 MG tablet TAKE 1 TABLET BY MOUTH TWICE A DAY 08/10/17   Rodolph Bong, MD  fluticasone [facility]) 50 MCG/ACT nasal spray SPRAY 2 SPRAYS INTO EACH NOSTRIL EVERY DAY 07/02/17   Sunnie Nielsen, DO  omeprazole (PRILOSEC) 40 MG  capsule Take 1 capsule (40 mg total) by mouth daily. 05/02/17   Sunnie Nielsen, DO  tamsulosin (FLOMAX) 0.4 MG CAPS capsule Take 1 capsule (0.4 mg total) by mouth daily. 04/23/17   Sunnie Nielsen, DO  traZODone (DESYREL) 100 MG tablet Take 1 tablet (100 mg total) by mouth at bedtime. 05/22/17   Sunnie Nielsen, DO  varenicline (CHANTIX STARTING MONTH PAK) 0.5 MG X 11 & 1 MG X 42 tablet Take one 0.5 mg tablet by mouth once daily for 3 days, then increase to one 0.5 mg tablet twice daily for 4 days, then increase to one 1 mg tablet twice daily. 05/22/17   Sunnie Nielsen, DO    Family History Family History  Problem Relation Age of Onset  . Hyperlipidemia Mother     Social History Social History   Tobacco Use  . Smoking status: Current Every Day Smoker    Packs/day: 1.00    Years: 40.00    Pack years: 40.00    Types: Cigarettes  . Smokeless tobacco: Never Used  Substance Use Topics  . Alcohol use: No  . Drug use: No     Allergies   Ambien [zolpidem tartrate]; Aspartame and phenylalanine; Restoril [temazepam]; and Codeine   Review of Systems Review of Systems  Constitutional: Positive for fever. Negative for chills.  HENT: Positive for congestion, rhinorrhea and sore throat. Negative for ear pain, trouble swallowing and voice change.   Respiratory: Positive for cough. Negative for shortness of breath.   Cardiovascular: Negative for chest pain and palpitations.  Gastrointestinal: Negative for abdominal pain, diarrhea, nausea and vomiting.  Musculoskeletal: Negative for arthralgias, back pain and myalgias.  Skin: Negative for rash.  Neurological: Negative for dizziness, light-headedness and headaches.     Physical Exam Triage Vital Signs ED Triage Vitals [09/09/17 1239]  Enc Vitals Group     BP 121/82     Pulse Rate 71     Resp      Temp 98.7 F (37.1 C)     Temp Source Oral     SpO2 96 %     Weight 118 lb 8 oz (53.8 kg)     Height  (1.753 m)      Head Circumference      Peak Flow      Pain Score 0     Pain Loc      Pain Edu?      Excl. in GC?    No data found.  Updated Vital Signs BP 121/82 (BP Location: Right Arm)   Pulse 71   Temp 98.7 F (37.1 C) (Oral)   Ht  (1.753 m)   Wt 118 lb 8 oz (53.8 kg)   SpO2 96%   BMI 17.50 kg/m   Visual Acuity Right Eye Distance:   Left Eye Distance:   Bilateral Distance:    Right Eye Near:   Left Eye Near:    Bilateral Near:     Physical Exam  Constitutional: He is oriented to person, place, and time. He appears well-developed and well-nourished. No distress.  HENT:  Head: Normocephalic and atraumatic.  Right Ear: Tympanic membrane normal.  Left Ear: Tympanic membrane normal.  Nose: Nose normal. Right sinus exhibits no maxillary sinus tenderness and no frontal sinus tenderness. Left sinus exhibits no maxillary sinus tenderness and no frontal sinus tenderness.  Mouth/Throat: Uvula is midline and mucous membranes are normal. Posterior oropharyngeal erythema present. No oropharyngeal exudate, posterior oropharyngeal edema or tonsillar abscesses.  Eyes: EOM are normal.  Neck: Normal range of motion. Neck supple.  Cardiovascular: Normal rate and regular rhythm.  Pulmonary/Chest: Effort normal and breath sounds normal. No stridor. No respiratory distress. He has no wheezes. He has no rales.  Musculoskeletal: Normal range of motion.  Lymphadenopathy:    He has no cervical adenopathy.  Neurological: He is alert and oriented to person, place, and time.  Skin: Skin is warm and dry. He is not diaphoretic.  Psychiatric: He has a normal mood and affect. His behavior is normal.  Nursing note and vitals reviewed.    UC Treatments / Results  Labs (all labs ordered are listed, but only abnormal results are displayed) Labs Reviewed - No data to display  EKG None  Radiology Dg Chest 2 View  Result Date: 09/09/2017 CLINICAL DATA:  Productive cough with blood in the sputum. EXAM:  CHEST - 2 VIEW COMPARISON:  None. FINDINGS: Cardiomediastinal silhouette is normal. Mediastinal contours appear intact. There is a focal airspace consolidation versus pulmonary mass, likely in the lingula. Hyperinflation of the lungs with biapical subpleural and peribronchial scarring. Possible small bilateral pleural effusions. Osseous structures are without acute abnormality. Soft tissues are grossly normal. IMPRESSION: Airspace consolidation versus pulmonary mass, likely in the lingula. Hyperinflation, emphysematous changes. Biapical subpleural and peribronchial scarring. Possible small bilateral pleural effusions versus  pleural retraction. Electronically Signed   By: Ted Mcalpine M.D.   On: 09/09/2017 13:27    Procedures Procedures (including critical care time)  Medications Ordered in UC Medications - No data to display  Initial Impression / Assessment and Plan / UC Course  I have reviewed the triage vital signs and the nursing notes.  Pertinent labs & imaging results that were available during my care of the patient were reviewed by me and considered in my medical decision making (see chart for details).     Lungs: CTAB, however, due to reported night sweats and fever, hemoptysis, and smoking hx, CXR ordered.  CXR questionable consolidation vs mass  Discussed with pt, will start tx for pneumonia, however, strongly recommend pt get follow up imaging of potential mass with a CT scan. Pt has routine physical scheduled with his PCP for 09/18/17 and requested CT be ordered through UC because his PCP is out of town this week.  Home care instructions provided.   Final Clinical Impressions(s) / UC Diagnoses   Final diagnoses:  Hemoptysis  Fever in adult  Current smoker  Mass of left lung     Discharge Instructions      Today you will be treated with azithromycin (antibiotic) for potential pneumonia.  Please take the medication as prescribed, however, due to your history of  smoking and a potential lung mass seen on today's chest x-ray, it is recommended you get a follow up chest CT scan to further evaluation the possible mass in your Left lung.  Please schedule an appointment this week to have your CT done so that it can be ready by the time you follow up with Dr. Lyn Hollingshead on May 14th for your routine physical.  It is recommended you send her a note in MyChart so she is aware of this imaging needing to be addressed during your next visit.    If you develop worsening symptoms- increased amount of blood coughed up, difficulty breathing, passing out, or other new concerning symptoms develop, please call 911 or go to the closest emergency department.     ED Prescriptions    Medication Sig Dispense Auth. Provider   azithromycin (ZITHROMAX) 250 MG tablet Take 1 tablet (250 mg total) by mouth daily. Take first 2 tablets together, then 1 every day until finished. 6 tablet Doroteo Glassman, Rashaud Ybarbo O, PA-C   benzonatate (TESSALON) 100 MG capsule Take 1-2 capsules (100-200 mg total) by mouth every 8 (eight) hours. 21 capsule Lurene Shadow, PA-C     Controlled Substance Prescriptions [place] Controlled Substance Registry consulted? Not Applicable   Rolla Plate 09/09/17 1432

## 2017-09-09 NOTE — ED Triage Notes (Signed)
Patient complaining of productive cough x 4 days, fever of 101 @ night, no fever during the day, Robitussin for cough, no ear pain, blood in sputum

## 2017-09-09 NOTE — Discharge Instructions (Signed)
°  Today you will be treated with azithromycin (antibiotic) for potential pneumonia.  Please take the medication as prescribed, however, due to your history of smoking and a potential lung mass seen on today's chest x-ray, it is recommended you get a follow up chest CT scan to further evaluation the possible mass in your Left lung.  Please schedule an appointment this week to have your CT done so that it can be ready by the time you follow up with Dr. Lyn Hollingshead on May 14th for your routine physical.  It is recommended you send her a note in MyChart so she is aware of this imaging needing to be addressed during your next visit.    If you develop worsening symptoms- increased amount of blood coughed up, difficulty breathing, passing out, or other new concerning symptoms develop, please call 911 or go to the closest emergency department.

## 2017-09-10 ENCOUNTER — Emergency Department (INDEPENDENT_AMBULATORY_CARE_PROVIDER_SITE_OTHER)
Admission: EM | Admit: 2017-09-10 | Discharge: 2017-09-10 | Disposition: A | Discharge status: Home / Self Care | Payer: BLUE CROSS/BLUE SHIELD | Source: Home / Self Care | Attending: Family Medicine | Admitting: Family Medicine

## 2017-09-10 ENCOUNTER — Emergency Department (INDEPENDENT_AMBULATORY_CARE_PROVIDER_SITE_OTHER): Payer: BLUE CROSS/BLUE SHIELD

## 2017-09-10 ENCOUNTER — Encounter: Payer: Self-pay | Admitting: Emergency Medicine

## 2017-09-10 DIAGNOSIS — R918 Other nonspecific abnormal finding of lung field: Secondary | ICD-10-CM | POA: Diagnosis not present

## 2017-09-10 DIAGNOSIS — R042 Hemoptysis: Secondary | ICD-10-CM | POA: Diagnosis not present

## 2017-09-10 DIAGNOSIS — J439 Emphysema, unspecified: Secondary | ICD-10-CM

## 2017-09-10 MED ORDER — IOPAMIDOL (ISOVUE-300) INJECTION 61%
100.0000 mL | Freq: Once | INTRAVENOUS | Status: AC | PRN
  Administered 2017-09-10: 75 mL via INTRAVENOUS

## 2017-09-10 NOTE — ED Triage Notes (Signed)
Pt here for ct scan and follow up from yesterday.

## 2017-09-11 ENCOUNTER — Telehealth: Payer: Self-pay | Admitting: Family Medicine

## 2017-09-11 MED ORDER — LEVOFLOXACIN 750 MG PO TABS: 750.0000 mg | ORAL_TABLET | Freq: Every day | ORAL | 0 refills | Status: DC

## 2017-09-11 NOTE — Telephone Encounter (Signed)
Patient reports no improvement after taking azithromycin for three days, and notes that Tessalon is not helpful at night. Discussed results of CT scan chest: recommend repeating scan in 1 to 2 months. Discontinue azithromycin and begin Levaquin 750 mg once daily for 7 days (discontinue trazodone while taking Levaquin). Begin Mucinex  twice daily with plenty of fluids. Take Tessalon , two at bedtime with Delsym.

## 2017-09-12 NOTE — ED Provider Notes (Signed)
[facility]    CSN: [id number] Arrival date & time: 09/10/17  0934     History   Chief Complaint Chief Complaint  Patient presents with  . Follow-up    HPI [name] is a 62 y.o. male.   Patient return for brief follow-up prior to CT scan chest.  He reports little change since starting azithromycin yesterday, although his fever has decreased.  He reports persistent productive cough and occasional hemoptysis.  No shortness of breath or pleuritic pain.  The history is provided by the patient.    Past Medical History:  Diagnosis Date  . History of anxiety     Patient Active Problem List   Diagnosis Date Noted  . Tobacco abuse disorder 09/05/2016  . Other fatigue 09/05/2016  . Hematuria 12/19/2013  . Insomnia 07/01/2013  . Esophageal stricture 03/31/2013  . Generalized anxiety disorder 12/27/2012  . History of anxiety     Past Surgical History:  Procedure Laterality Date  . DEBRIDEMENT TENNIS ELBOW  2001, 2002  . HERNIA REPAIR  2000       Home Medications    Prior to Admission medications   Medication Sig Start Date End Date Taking? Authorizing Provider  ALPRAZolam Prudy Feeler) 1 MG tablet TAKE 1 & 1/2 TABLET AT BEDTIME AS NEEDED FOR SLEEP 07/31/17   Sunnie Nielsen, DO  azithromycin (ZITHROMAX) 250 MG tablet Take 1 tablet (250 mg total) by mouth daily. Take first 2 tablets together, then 1 every day until finished. 09/09/17   Lurene Shadow, PA-C  benzonatate (TESSALON) 100 MG capsule Take 1-2 capsules (100-200 mg total) by mouth every 8 (eight) hours. 09/09/17   Lurene Shadow, PA-C  CHANTIX 1 MG tablet TAKE 1 TABLET BY MOUTH TWICE A DAY 08/10/17   Rodolph Bong, MD  fluticasone [facility]) 50 MCG/ACT nasal spray SPRAY 2 SPRAYS INTO EACH NOSTRIL EVERY DAY 07/02/17   Sunnie Nielsen, DO  levofloxacin (LEVAQUIN) 750 MG tablet Take 1 tablet (750 mg total) by mouth daily. 09/11/17   Lattie Haw, MD  omeprazole (PRILOSEC) 40 MG capsule Take 1 capsule (40 mg  total) by mouth daily. 05/02/17   Sunnie Nielsen, DO  tamsulosin (FLOMAX) 0.4 MG CAPS capsule Take 1 capsule (0.4 mg total) by mouth daily. 04/23/17   Sunnie Nielsen, DO  traZODone (DESYREL) 100 MG tablet Take 1 tablet (100 mg total) by mouth at bedtime. 05/22/17   Sunnie Nielsen, DO  varenicline (CHANTIX STARTING MONTH PAK) 0.5 MG X 11 & 1 MG X 42 tablet Take one 0.5 mg tablet by mouth once daily for 3 days, then increase to one 0.5 mg tablet twice daily for 4 days, then increase to one 1 mg tablet twice daily. 05/22/17   Sunnie Nielsen, DO    Family History Family History  Problem Relation Age of Onset  . Hyperlipidemia Mother     Social History Social History   Tobacco Use  . Smoking status: Current Every Day Smoker    Packs/day: 1.00    Years: 40.00    Pack years: 40.00    Types: Cigarettes  . Smokeless tobacco: Never Used  Substance Use Topics  . Alcohol use: No  . Drug use: No     Allergies   Ambien [zolpidem tartrate]; Aspartame and phenylalanine; Restoril [temazepam]; and Codeine   Review of Systems Review of Systems No sore throat + cough No pleuritic pain No wheezing No nasal congestion No post-nasal drainage No sinus pain/pressure No itchy/red eyes No earache +  hemoptysis No SOB No fever, + chills No nausea No vomiting No abdominal pain No diarrhea No urinary symptoms No skin rash + fatigue No myalgias No headache    Physical Exam Triage Vital Signs ED Triage Vitals [09/10/17 0946]  Enc Vitals Group     BP      Pulse      Resp      Temp 98.3 F (36.8 C)     Temp Source Oral     SpO2      Weight      Height      Head Circumference      Peak Flow      Pain Score      Pain Loc      Pain Edu?      Excl. in GC?    No data found.  Updated Vital Signs Temp 98.3 F (36.8 C) (Oral)   Visual Acuity Right Eye Distance:   Left Eye Distance:   Bilateral Distance:    Right Eye Near:   Left Eye Near:    Bilateral  Near:     Physical Exam Nursing notes and Vital Signs reviewed. Appearance:  Patient appears stated age, somewhat cachectic, and in no acute distress.    Eyes:  Pupils are equal, round, and reactive to light and accomodation.  Extraocular movement is intact.  Conjunctivae are not inflamed   Pharynx:  Normal; moist mucous membranes  Neck:  Supple.  No adenopathy Lungs:   Course breath sounds.  Breath sounds are equal.  Moving air well. Heart:  Regular rate and rhythm without murmurs, rubs, or gallops.  Abdomen:  Nontender without masses or hepatosplenomegaly.  Bowel sounds are present.  No CVA or flank tenderness.  Extremities:  No edema.  Skin:  No rash present.     UC Treatments / Results  Labs (all labs ordered are listed, but only abnormal results are displayed) Labs Reviewed - No data to display  EKG None  Radiology No results found.  Procedures Procedures (including critical care time)  Medications Ordered in UC Medications - No data to display  Initial Impression / Assessment and Plan / UC Course  I have reviewed the triage vital signs and the nursing notes.  Pertinent labs & imaging results that were available during my care of the patient were reviewed by me and considered in my medical decision making (see chart for details).    CT scan scheduled this morning.  Followup with PCP. Final Clinical Impressions(s) / UC Diagnoses   Final diagnoses:  Hemoptysis     Discharge Instructions     Continue present medication.    ED Prescriptions    None        Lattie Haw, MD 09/12/17 [phone]

## 2017-09-12 NOTE — Discharge Instructions (Signed)
Continue present medication 

## 2017-09-13 ENCOUNTER — Encounter: Payer: Self-pay | Admitting: Osteopathic Medicine

## 2017-09-18 ENCOUNTER — Ambulatory Visit (INDEPENDENT_AMBULATORY_CARE_PROVIDER_SITE_OTHER): Payer: BLUE CROSS/BLUE SHIELD | Admitting: Osteopathic Medicine

## 2017-09-18 ENCOUNTER — Encounter: Payer: Self-pay | Admitting: Osteopathic Medicine

## 2017-09-18 VITALS — BP 132/80 | HR 62 | Temp 98.2°F | Wt 123.6 lb

## 2017-09-18 DIAGNOSIS — J159 Unspecified bacterial pneumonia: Secondary | ICD-10-CM

## 2017-09-18 DIAGNOSIS — G4700 Insomnia, unspecified: Secondary | ICD-10-CM

## 2017-09-18 MED ORDER — DOXYCYCLINE HYCLATE 100 MG PO TABS: 100.0000 mg | ORAL_TABLET | Freq: Two times a day (BID) | ORAL | 0 refills | Status: DC

## 2017-09-18 MED ORDER — BENZONATATE 200 MG PO CAPS: 200.0000 mg | ORAL_CAPSULE | Freq: Three times a day (TID) | ORAL | 2 refills | Status: DC | PRN

## 2017-09-18 MED ORDER — DOXEPIN HCL 3 MG PO TABS: 3.0000 mg | ORAL_TABLET | Freq: Every day | ORAL | 3 refills | Status: DC

## 2017-09-18 MED ORDER — AMOXICILLIN-POT CLAVULANATE 875-125 MG PO TABS: 1.0000 | ORAL_TABLET | Freq: Two times a day (BID) | ORAL | 0 refills | Status: DC

## 2017-09-18 NOTE — Patient Instructions (Signed)
Plan: Switch up antibiotics Cough meds refilled If worsening symptoms, let's get you to a specialist! Recheck CT Try Doxepin for sleep

## 2017-09-18 NOTE — Progress Notes (Signed)
HPI: Dylan Potter is a 62 y.o. male who  has a past medical history of History of anxiety.  he presents to [facility] today, 09/18/17,  for chief complaint of:  Physical  Recently treated for hemoptysis/CT positive for pneumonia.  Azithromycin was not particularly helpful for him, allergic reaction to Levaquin.  Feeling a little bit better, cough is still persistent.  No fever/night sweats.  (+) Smoking history 1 ppd x 45 years altogether   Patient is accompanied by wife who assists with history-taking.   Insomnia: Friend is taking doxepin and he would like to try this  Past medical history, surgical history, and family history reviewed.  Current medication list and allergy/intolerance information reviewed.   (See remainder of HPI, as well as ROS, PE below)  CT images reviewed with the patient    ASSESSMENT/PLAN:   Community acquired bacterial pneumonia - Plan: amoxicillin-clavulanate (AUGMENTIN) 875-125 MG tablet, doxycycline (VIBRA-TABS) 100 MG tablet, CT Chest W Contrast -trial escalation of antibiotics, plan for repeat CT   Insomnia, unspecified type - Plan: Doxepin HCl 3 MG TABS   Meds ordered this encounter  Medications  . Doxepin HCl 3 MG TABS    Sig: Take 1-2 tablets (3-6 mg total) by mouth at bedtime.    Dispense:  60 tablet    Refill:  3  . amoxicillin-clavulanate (AUGMENTIN) 875-125 MG tablet    Sig: Take 1 tablet by mouth 2 (two) times daily.    Dispense:  14 tablet    Refill:  0  . doxycycline (VIBRA-TABS) 100 MG tablet    Sig: Take 1 tablet (100 mg total) by mouth 2 (two) times daily.    Dispense:  14 tablet    Refill:  0  . benzonatate (TESSALON) 200 MG capsule    Sig: Take 1 capsule (200 mg total) by mouth 3 (three) times daily as needed for cough.    Dispense:  30 capsule    Refill:  2    Patient Instructions  Plan: Switch up antibiotics Cough meds refilled If worsening symptoms, let's get you to a  specialist! Recheck CT Try Doxepin for sleep    Follow-up plan: Return for recheck after CT or sooner if needed.    #################### ################### #################    Outpatient Encounter Medications as of 09/18/2017  Medication Sig  . ALPRAZolam (XANAX) 1 MG tablet TAKE 1 & 1/2 TABLET AT BEDTIME AS NEEDED FOR SLEEP  . benzonatate (TESSALON) 100 MG capsule Take 1-2 capsules (100-200 mg total) by mouth every 8 (eight) hours.  . CHANTIX 1 MG tablet TAKE 1 TABLET BY MOUTH TWICE A DAY  . fluticasone (FLONASE) 50 MCG/ACT nasal spray SPRAY 2 SPRAYS INTO EACH NOSTRIL EVERY DAY  . omeprazole (PRILOSEC) 40 MG capsule Take 1 capsule (40 mg total) by mouth daily.  . tamsulosin (FLOMAX) 0.4 MG CAPS capsule Take 1 capsule (0.4 mg total) by mouth daily.  . traZODone (DESYREL) 100 MG tablet Take 1 tablet (100 mg total) by mouth at bedtime.  [name] azithromycin (ZITHROMAX) 250 MG tablet Take 1 tablet (250 mg total) by mouth daily. Take first 2 tablets together, then 1 every day until finished. (Patient not taking: Reported on 09/18/2017)  . levofloxacin (LEVAQUIN) 750 MG tablet Take 1 tablet (750 mg total) by mouth daily. (Patient not taking: Reported on 09/18/2017)  . varenicline (CHANTIX STARTING MONTH PAK) 0.5 MG X 11 & 1 MG X 42 tablet Take one 0.5 mg tablet by mouth once daily  for 3 days, then increase to one 0.5 mg tablet twice daily for 4 days, then increase to one 1 mg tablet twice daily. (Patient not taking: Reported on 09/18/2017)   No facility-administered encounter medications on file as of 09/18/2017.    Allergies  Allergen Reactions  . Ambien [Zolpidem Tartrate]     Sleepwalking  . Aspartame And Phenylalanine   . Restoril [Temazepam]     No help with sleep  . Codeine Rash and Itching  . Levaquin [Levofloxacin In D5w] Rash    Burning rash in genital area      Review of Systems:  Constitutional: No recent illness  HEENT: No  headache, no vision change  Cardiac: No  chest  pain, No  pressure, No palpitations  Respiratory:  +shortness of breath. +Cough   Gastrointestinal: No  abdominal pain, no change on bowel habits  Musculoskeletal: No new myalgia/arthralgia  Skin: No  Rash  Hem/Onc: No  easy bruising/bleeding, No  abnormal lumps/bumps  Neurologic: No  weakness, No  Dizziness   Exam:  BP 132/80 (BP Location: Left Arm, Patient Position: Sitting, Cuff Size: Normal)   Pulse 62   Temp 98.2 F (36.8 C) (Oral)   Wt 123 lb 9.6 oz (56.1 kg)   BMI 18.25 kg/m   Constitutional: VS see above. General Appearance: alert, well-developed, well-nourished, NAD  Eyes: Normal lids and conjunctive, non-icteric sclera  Ears, Nose, Mouth, Throat: MMM, Normal external inspection ears/nares/mouth/lips/gums.  Neck: No masses, trachea midline.   Respiratory: Normal respiratory effort. no wheeze, no rhonchi, no rales, diminished breath sounds bilaterally  Cardiovascular: S1/S2 normal, no murmur, no rub/gallop auscultated. RRR.   Musculoskeletal: Gait normal. Symmetric and independent movement of all extremities  Neurological: Normal balance/coordination. No tremor.  Skin: warm, dry, intact.   Psychiatric: Normal judgment/insight. Normal mood and affect. Oriented x3.   Visit summary with medication list and pertinent instructions was printed for patient to review, alert [place] if any changes needed. All questions at time of visit were answered - patient instructed to contact office with any additional concerns. ER/RTC precautions were reviewed with the patient and understanding verbalized.   Follow-up plan: Return for recheck after CT or sooner if needed.  Note: Total time spent 25 minutes, greater than 50% of the visit was spent face-to-face counseling and coordinating care for the following: The primary encounter diagnosis was Community acquired bacterial pneumonia. A diagnosis of Insomnia, unspecified type was also pertinent to this visit.[name]  Please note: voice  recognition software was used to produce this document, and typos may escape review. Please contact Dr. Lyn Hollingshead for any needed clarifications.

## 2017-09-20 ENCOUNTER — Encounter: Payer: Self-pay | Admitting: Osteopathic Medicine

## 2017-10-12 ENCOUNTER — Ambulatory Visit (INDEPENDENT_AMBULATORY_CARE_PROVIDER_SITE_OTHER): Payer: BLUE CROSS/BLUE SHIELD

## 2017-10-12 DIAGNOSIS — J159 Unspecified bacterial pneumonia: Secondary | ICD-10-CM

## 2017-10-12 DIAGNOSIS — J439 Emphysema, unspecified: Secondary | ICD-10-CM | POA: Diagnosis not present

## 2017-10-12 MED ORDER — IOPAMIDOL (ISOVUE-300) INJECTION 61%
100.0000 mL | Freq: Once | INTRAVENOUS | Status: AC | PRN
  Administered 2017-10-12: 75 mL via INTRAVENOUS

## 2017-10-18 ENCOUNTER — Ambulatory Visit: Payer: BLUE CROSS/BLUE SHIELD | Admitting: Osteopathic Medicine

## 2017-10-18 ENCOUNTER — Encounter: Payer: Self-pay | Admitting: Osteopathic Medicine

## 2017-10-18 VITALS — BP 131/77 | HR 67 | Temp 98.2°F | Wt 126.4 lb

## 2017-10-18 DIAGNOSIS — J159 Unspecified bacterial pneumonia: Secondary | ICD-10-CM | POA: Diagnosis not present

## 2017-10-18 DIAGNOSIS — R05 Cough: Secondary | ICD-10-CM | POA: Diagnosis not present

## 2017-10-18 DIAGNOSIS — G4700 Insomnia, unspecified: Secondary | ICD-10-CM | POA: Diagnosis not present

## 2017-10-18 DIAGNOSIS — R9389 Abnormal findings on diagnostic imaging of other specified body structures: Secondary | ICD-10-CM

## 2017-10-18 DIAGNOSIS — R053 Chronic cough: Secondary | ICD-10-CM

## 2017-10-18 MED ORDER — DOXEPIN HCL 25 MG PO CAPS: 25.0000 mg | ORAL_CAPSULE | Freq: Every evening | ORAL | 1 refills | Status: DC | PRN

## 2017-10-18 MED ORDER — BENZONATATE 200 MG PO CAPS: 200.0000 mg | ORAL_CAPSULE | Freq: Three times a day (TID) | ORAL | 2 refills | Status: DC | PRN

## 2017-10-18 MED ORDER — ALPRAZOLAM 1 MG PO TABS: 1.0000 mg | ORAL_TABLET | Freq: Every evening | ORAL | 0 refills | Status: DC | PRN

## 2017-10-18 NOTE — Progress Notes (Signed)
HPI: Dylan Potter is a 62 y.o. male who  has a past medical history of History of anxiety.  he presents to [facility] today, 10/18/17,  for chief complaint of:  Review results  Seen a month ago for CAP, abnormal CT scan at that time, advised follow-up.  On repeat imaging, radiology report mentions improvement but on personal review I really do not see much difference.  Patient is still complaining of cough problems.  He states he was doing a bit better on the doxycycline antibiotics but once coming off of these he feels the cough is a little bit worse than it was on the antibiotics.  Insomnia: Able to get the doxepin filled, apparently to 3 mg when in stock.  He states his friend is taking about 25 to 50 mg and would like to try this dose.  Still requiring use of Xanax to get to sleep at all, still having issues sleeping through the night.  We tried a few other medications in the past, see notes, which were ineffective     Past medical history, surgical history, and family history reviewed.  Current medication list and allergy/intolerance information reviewed.   (See remainder of HPI, ROS, Phys Exam below)    ASSESSMENT/PLAN:   Abnormal CT scan - I think reviewing images with the pulmonologist would be helpful, we can go ahead and get PFTs here at least preliminary and consider start inhaler - Plan: Ambulatory referral to Pulmonology  Community acquired bacterial pneumonia - Plan: Ambulatory referral to Pulmonology  Insomnia, unspecified type - Plan: doxepin (SINEQUAN) 25 MG capsule, ALPRAZolam (XANAX) 1 MG tablet  Persistent cough for 3 weeks or longer - Plan: benzonatate (TESSALON) 200 MG capsule, Ambulatory referral to Pulmonology   Meds ordered this encounter  Medications  . doxepin (SINEQUAN) 25 MG capsule    Sig: Take 1-2 capsules (25-50 mg total) by mouth at bedtime as needed.    Dispense:  60 capsule    Refill:  1  . ALPRAZolam (XANAX) 1  MG tablet    Sig: Take 1-1.5 tablets (1-1.5 mg total) by mouth at bedtime as needed for anxiety.    Dispense:  135 tablet    Refill:  0    Not to exceed 5 additional fills before 11/03/2017  . benzonatate (TESSALON) 200 MG capsule    Sig: Take 1 capsule (200 mg total) by mouth 3 (three) times daily as needed for cough.    Dispense:  30 capsule    Refill:  2     Follow-up plan: Return for lung function test sometime next week or so .     ############################################ ############################################ ############################################ ############################################    Outpatient Encounter Medications as of 10/18/2017  Medication Sig  . ALPRAZolam (XANAX) 1 MG tablet TAKE 1 & 1/2 TABLET AT BEDTIME AS NEEDED FOR SLEEP  . benzonatate (TESSALON) 200 MG capsule Take 1 capsule (200 mg total) by mouth 3 (three) times daily as needed for cough.  . CHANTIX 1 MG tablet TAKE 1 TABLET BY MOUTH TWICE A DAY  . Doxepin HCl 3 MG TABS Take 1-2 tablets (3-6 mg total) by mouth at bedtime.  [name] doxycycline (VIBRA-TABS) 100 MG tablet Take 1 tablet (100 mg total) by mouth 2 (two) times daily.  . fluticasone (FLONASE) 50 MCG/ACT nasal spray SPRAY 2 SPRAYS INTO EACH NOSTRIL EVERY DAY  . omeprazole (PRILOSEC) 40 MG capsule Take 1 capsule (40 mg total) by mouth daily.  . tamsulosin (FLOMAX) 0.4  MG CAPS capsule Take 1 capsule (0.4 mg total) by mouth daily.  [name]. amoxicillin-clavulanate (AUGMENTIN) 875-125 MG tablet Take 1 tablet by mouth 2 (two) times daily. (Patient not taking: Reported on 10/18/2017)   No facility-administered encounter medications on file as of 10/18/2017.    Allergies  Allergen Reactions  . Ambien [Zolpidem Tartrate]     Sleepwalking  . Aspartame And Phenylalanine   . Restoril [Temazepam]     No help with sleep  . Codeine Rash and Itching  . Levaquin [Levofloxacin In D5w] Rash    Burning rash in genital area      Review of  Systems:  Constitutional: No recent illness  HEENT: No  headache, no vision change  Cardiac: No  chest pain, No  pressure, No palpitations  Respiratory:  No  shortness of breath. +Cough  Skin: No  Rash  Neurologic: No  weakness, No  Dizziness  Psychiatric: No  concerns with depression, No  concerns with anxiety  Exam:  BP 131/77 (BP Location: Left Arm, Patient Position: Sitting, Cuff Size: Normal)   Pulse 67   Temp 98.2 F (36.8 C) (Oral)   Wt 126 lb 6.4 oz (57.3 kg)   BMI 18.67 kg/m   Constitutional: VS see above. General Appearance: alert, well-developed, well-nourished, NAD  Eyes: Normal lids and conjunctive, non-icteric sclera  Ears, Nose, Mouth, Throat: MMM, Normal external inspection ears/nares/mouth/lips/gums.  Neck: No masses, trachea midline.   Respiratory: Normal respiratory effort.  Menaced breath sounds bilaterally but no wheeze, no rhonchi, no rales  Cardiovascular: S1/S2 normal, no murmur, no rub/gallop auscultated. RRR.   Musculoskeletal: Gait normal. Symmetric and independent movement of all extremities  Neurological: Normal balance/coordination. No tremor.  Skin: warm, dry, intact.   Psychiatric: Normal judgment/insight. Normal mood and affect. Oriented x3.   Visit summary with medication list and pertinent instructions was printed for patient to review, advised to alert us if any changes needed. All questions at time of visit were answered - patient instructed to contact office with any additional concerns. ER/RTC precautions were reviewed with the patient and understanding verbalized.   Follow-up plan: Return for lung function test sometime next week or so .  Note: Total time spent 25 minutes, greater than 50% of the visit was spent face-to-face counseling and coordinating care for the following: The primary encounter diagnosis was Abnormal CT scan. Diagnoses of Community acquired bacterial pneumonia, Insomnia, unspecified type, and Persistent cough  for 3 weeks or longer were also pertinent to this visit.[name].  Please note: voice recognition software was used to produce this document, and typos may escape review. Please contact Dr. Lyn HollingsheadAlexander for any needed clarifications.

## 2017-10-24 ENCOUNTER — Other Ambulatory Visit: Payer: BLUE CROSS/BLUE SHIELD

## 2017-10-25 ENCOUNTER — Ambulatory Visit: Payer: BLUE CROSS/BLUE SHIELD | Admitting: Osteopathic Medicine

## 2017-10-29 ENCOUNTER — Ambulatory Visit: Payer: BLUE CROSS/BLUE SHIELD | Admitting: Osteopathic Medicine

## 2017-11-01 DIAGNOSIS — F17210 Nicotine dependence, cigarettes, uncomplicated: Secondary | ICD-10-CM | POA: Insufficient documentation

## 2017-11-01 DIAGNOSIS — R918 Other nonspecific abnormal finding of lung field: Secondary | ICD-10-CM | POA: Insufficient documentation

## 2017-11-11 ENCOUNTER — Other Ambulatory Visit: Payer: Self-pay | Admitting: Osteopathic Medicine

## 2017-11-11 DIAGNOSIS — G4700 Insomnia, unspecified: Secondary | ICD-10-CM

## 2017-11-20 ENCOUNTER — Encounter: Payer: BLUE CROSS/BLUE SHIELD | Admitting: Osteopathic Medicine

## 2017-12-03 ENCOUNTER — Other Ambulatory Visit: Payer: Self-pay | Admitting: Osteopathic Medicine

## 2017-12-08 ENCOUNTER — Other Ambulatory Visit: Payer: Self-pay

## 2017-12-08 ENCOUNTER — Emergency Department
Admission: EM | Admit: 2017-12-08 | Discharge: 2017-12-08 | Disposition: A | Discharge status: Home / Self Care | Payer: BLUE CROSS/BLUE SHIELD | Source: Home / Self Care | Attending: Emergency Medicine | Admitting: Emergency Medicine

## 2017-12-08 ENCOUNTER — Encounter: Payer: Self-pay | Admitting: Emergency Medicine

## 2017-12-08 DIAGNOSIS — H5789 Other specified disorders of eye and adnexa: Secondary | ICD-10-CM | POA: Diagnosis not present

## 2017-12-08 MED ORDER — ERYTHROMYCIN 5 MG/GM OP OINT: TOPICAL_OINTMENT | OPHTHALMIC | 1 refills | Status: DC

## 2017-12-08 NOTE — ED Triage Notes (Signed)
Patient feels he has a right eye corneal abrasion; soreness for over a week; tried rinsing out several times/ways; no known foreign body; always wears safety goggles at work. Needs corrective glasses but did not bring today.

## 2017-12-08 NOTE — ED Provider Notes (Signed)
[facility]    CSN: [id number] Arrival date & time: 12/08/17  1105     History   Chief Complaint Chief Complaint  Patient presents with  . Eye Problem    HPI Dylan Potter is a 62 y.o. male.  Patient enters with a 2-week history of scratchy sensation in his right eye.  He works for an [employer] and denies any known foreign body to the eye.  He has not done any painting or grinding.  His vision is not significantly changed.  He wears glasses normally.  Eye Problem  Associated symptoms: photophobia and redness   Associated symptoms: no discharge and no itching     Past Medical History:  Diagnosis Date  . History of anxiety     Patient Active Problem List   Diagnosis Date Noted  . Tobacco abuse disorder 09/05/2016  . Other fatigue 09/05/2016  . Hematuria 12/19/2013  . Insomnia 07/01/2013  . Esophageal stricture 03/31/2013  . Generalized anxiety disorder 12/27/2012  . History of anxiety     Past Surgical History:  Procedure Laterality Date  . DEBRIDEMENT TENNIS ELBOW  2001, 2002  . HERNIA REPAIR  2000       Home Medications    Prior to Admission medications   Medication Sig Start Date End Date Taking? Authorizing Provider  ALPRAZolam Prudy Feeler) 1 MG tablet Take 1-1.5 tablets (1-1.5 mg total) by mouth at bedtime as needed for anxiety. 10/18/17 01/16/18  Sunnie Nielsen, DO  benzonatate (TESSALON) 200 MG capsule Take 1 capsule (200 mg total) by mouth 3 (three) times daily as needed for cough. 10/18/17   Sunnie Nielsen, DO  CHANTIX 1 MG tablet TAKE 1 TABLET BY MOUTH TWICE A DAY 08/10/17   Rodolph Bong, MD  doxepin (SINEQUAN) 25 MG capsule Take 1-2 capsules (25-50 mg total) by mouth at bedtime as needed. 10/18/17   Sunnie Nielsen, DO  erythromycin ophthalmic ointment Place a 1/2 inch ribbon of ointment into the lower eyelid.  3 times a day. 12/08/17   Collene Gobble, MD  fluticasone (FLONASE) 50 MCG/ACT nasal spray SPRAY 2 SPRAYS INTO EACH NOSTRIL  EVERY DAY 07/02/17   Sunnie Nielsen, DO  omeprazole (PRILOSEC) 40 MG capsule Take 1 capsule (40 mg total) by mouth daily. 05/02/17   Sunnie Nielsen, DO  tamsulosin (FLOMAX) 0.4 MG CAPS capsule Take 1 capsule (0.4 mg total) by mouth daily. 04/23/17   Sunnie Nielsen, DO  tamsulosin (FLOMAX) 0.4 MG CAPS capsule TAKE 1 CAPSULE BY MOUTH EVERY DAY 12/04/17   Sunnie Nielsen, DO    Family History Family History  Problem Relation Age of Onset  . Hyperlipidemia Mother     Social History Social History   Tobacco Use  . Smoking status: Current Every Day Smoker    Packs/day: 1.00    Years: 40.00    Pack years: 40.00    Types: Cigarettes  . Smokeless tobacco: Never Used  Substance Use Topics  . Alcohol use: No  . Drug use: No     Allergies   Ambien [zolpidem tartrate]; Aspartame and phenylalanine; Restoril [temazepam]; Codeine; and Levaquin [levofloxacin in d5w]   Review of Systems Review of Systems  Constitutional: Negative.   HENT: Negative.   Eyes: Positive for photophobia and redness. Negative for pain, discharge and itching.  Respiratory: Negative.      Physical Exam Triage Vital Signs ED Triage Vitals  Enc Vitals Group     BP 12/08/17 1202 123/78     Pulse Rate  12/08/17 1202 (!) 58     Resp 12/08/17 1202 16     Temp 12/08/17 1202 97.7 F (36.5 C)     Temp Source 12/08/17 1202 Oral     SpO2 12/08/17 1202 99 %     Weight 12/08/17 1203 125 lb (56.7 kg)     Height 12/08/17 1203 5\' 10"  (1.778 m)     Head Circumference --      Peak Flow --      Pain Score 12/08/17 1203 1     Pain Loc --      Pain Edu? --      Excl. in GC? --    No data found.  Updated Vital Signs BP 123/78 (BP Location: Right Arm)   Pulse (!) 58   Temp 97.7 F (36.5 C) (Oral)   Resp 16   Ht 5\' 10"  (1.778 m)   Wt 125 lb (56.7 kg)   SpO2 99%   BMI 17.94 kg/m   Visual Acuity Right Eye Distance: (20/70) Left Eye Distance: 20/20 Bilateral Distance: needs corrective lenses/ not  with him  Right Eye Near:   Left Eye Near:    Bilateral Near:     Physical Exam  Constitutional: He appears well-developed and well-nourished.  HENT:  Head: Normocephalic.  Eyes: Pupils are equal, round, and reactive to light. Conjunctivae and EOM are normal.  There is a low-grade blepharitis present.  There is no purulent discharge noted.  Lid was everted and swabbed.  3 drops of tetracaine were used in flourscein was used.  No uptake.     UC Treatments / Results  Labs (all labs ordered are listed, but only abnormal results are displayed) Labs Reviewed - No data to display  EKG None  Radiology No results found.  Procedures Procedures (including critical care time)  Medications Ordered in UC Medications - No data to display  Initial Impression / Assessment and Plan / UC Course  I have reviewed the triage vital signs and the nursing notes.  Pertinent labs & imaging results that were available during my care of the patient were reviewed by me and considered in my medical decision making (see chart for details). I did not see any signs of a corneal abrasion on his examination.  He does have a low-grade blepharitis.  We will treat this with erythromycin ointment and he will follow-up with his ophthalmologist if symptoms continue.  Visual acuity was 20/70 in the right eye.  I advised him to follow-up with his ophthalmologist regarding this.  He did not have glasses with him to check with corrective lenses.     Final Clinical Impressions(s) / UC Diagnoses   Final diagnoses:  Eye irritation     Discharge Instructions     Use ointment 3 times a day. Follow-up with your eye doctor if symptoms persist.    ED Prescriptions    Medication Sig Dispense Auth. Provider   erythromycin ophthalmic ointment Place a 1/2 inch ribbon of ointment into the lower eyelid.  3 times a day. 15 g Collene Gobbleaub, Chaynce Schafer A, MD     Controlled Substance Prescriptions [place] Controlled Substance Registry  consulted? Not Applicable   Collene Gobbleaub, Carolyn Sylvia A, MD 12/08/17 [phone]

## 2017-12-08 NOTE — Discharge Instructions (Addendum)
Use ointment 3 times a day. Follow-up with your eye doctor if symptoms persist.

## 2017-12-27 ENCOUNTER — Encounter: Payer: Self-pay | Admitting: Osteopathic Medicine

## 2017-12-30 ENCOUNTER — Other Ambulatory Visit: Payer: Self-pay | Admitting: Osteopathic Medicine

## 2017-12-30 DIAGNOSIS — G4700 Insomnia, unspecified: Secondary | ICD-10-CM

## 2018-01-24 ENCOUNTER — Other Ambulatory Visit: Payer: Self-pay | Admitting: Osteopathic Medicine

## 2018-01-24 DIAGNOSIS — R053 Chronic cough: Secondary | ICD-10-CM

## 2018-01-24 DIAGNOSIS — G4700 Insomnia, unspecified: Secondary | ICD-10-CM

## 2018-01-24 DIAGNOSIS — R05 Cough: Secondary | ICD-10-CM

## 2018-01-24 NOTE — Telephone Encounter (Signed)
Pt has been updated.  

## 2018-01-24 NOTE — Telephone Encounter (Signed)
[facility] alprazolam and benzonatate.

## 2018-02-20 ENCOUNTER — Other Ambulatory Visit: Payer: Self-pay | Admitting: Internal Medicine

## 2018-02-20 ENCOUNTER — Encounter: Payer: Self-pay | Admitting: Osteopathic Medicine

## 2018-02-20 DIAGNOSIS — R918 Other nonspecific abnormal finding of lung field: Secondary | ICD-10-CM

## 2018-02-28 ENCOUNTER — Ambulatory Visit (INDEPENDENT_AMBULATORY_CARE_PROVIDER_SITE_OTHER): Payer: BLUE CROSS/BLUE SHIELD | Admitting: Osteopathic Medicine

## 2018-02-28 ENCOUNTER — Other Ambulatory Visit: Payer: BLUE CROSS/BLUE SHIELD

## 2018-02-28 ENCOUNTER — Encounter: Payer: Self-pay | Admitting: Osteopathic Medicine

## 2018-02-28 VITALS — BP 119/74 | HR 57 | Temp 97.6°F | Wt 122.2 lb

## 2018-02-28 DIAGNOSIS — N200 Calculus of kidney: Secondary | ICD-10-CM | POA: Insufficient documentation

## 2018-02-28 DIAGNOSIS — N401 Enlarged prostate with lower urinary tract symptoms: Secondary | ICD-10-CM | POA: Diagnosis not present

## 2018-02-28 DIAGNOSIS — G4700 Insomnia, unspecified: Secondary | ICD-10-CM

## 2018-02-28 DIAGNOSIS — F32A Depression, unspecified: Secondary | ICD-10-CM | POA: Insufficient documentation

## 2018-02-28 DIAGNOSIS — Z23 Encounter for immunization: Secondary | ICD-10-CM

## 2018-02-28 DIAGNOSIS — F329 Major depressive disorder, single episode, unspecified: Secondary | ICD-10-CM | POA: Insufficient documentation

## 2018-02-28 DIAGNOSIS — K5790 Diverticulosis of intestine, part unspecified, without perforation or abscess without bleeding: Secondary | ICD-10-CM

## 2018-02-28 DIAGNOSIS — R3912 Poor urinary stream: Secondary | ICD-10-CM

## 2018-02-28 DIAGNOSIS — J439 Emphysema, unspecified: Secondary | ICD-10-CM | POA: Insufficient documentation

## 2018-02-28 MED ORDER — TRAZODONE HCL 100 MG PO TABS: 100.0000 mg | ORAL_TABLET | Freq: Every day | ORAL | 3 refills | Status: DC

## 2018-02-28 NOTE — Progress Notes (Signed)
HPI: Dylan Potter is a 62 y.o. male who  has a past medical history of History of anxiety.  he presents to [facility] today, 02/28/18,  for chief complaint of:  ER follow-up  Recently seen in emergency department 02/18/2018, concern on CT scan for diverticulitis, patient was released on Augmentin antibiotics, followed up with GI 2 days ago and is planning for colonoscopy/EGD tomorrow.  Reports generalized abdominal pain, comes and goes, associated with rectal bleeding rides and dysphagia.  Really improved with the treatment from the emergency room.  Reports BPH symptoms have been worsening, he has stopped taking the tamsulosin due to concern for cataracts.  He also has some concerns about a kidney stone seen as incidental finding on the CT scan.  [facility] referral to urology.  Insomnia about the same, he has restarted the trazodone, taken himself off doxepin.  Still dependent on alprazolam.    Past medical history, surgical history, and family history reviewed.  Current medication list and allergy/intolerance information reviewed.   (See remainder of HPI, ROS, Phys Exam below)    ASSESSMENT/PLAN:   Diverticulosis - Per GI recommendations  Need for influenza vaccination - Plan: Flu vaccine HIGH DOSE PF (Fluzone High dose)  Insomnia, unspecified type - Refill tramadol, benzodiazepine dependence was an issue before coming to see me, okay to refill for now, will revisit tapering - Plan: traZODone (DESYREL) 100 MG tablet  Benign prostatic hyperplasia with weak urinary stream - Plan: Ambulatory referral to Urology  Right renal stone - Plan: Ambulatory referral to Urology   Meds ordered this encounter  Medications  . traZODone (DESYREL) 100 MG tablet    Sig: Take 1 tablet (100 mg total) by mouth at bedtime.    Dispense:  90 tablet    Refill:  3    Cancel previous #30 Rx thanks      Follow-up plan: Return for annual physical due  08/2018.                         ############################################ ############################################ ############################################ ############################################    Current Meds  Medication Sig  . ALPRAZolam (XANAX) 1 MG tablet TAKE 1-1.5 TABLETS (1-1.5 MG TOTAL) BY MOUTH AT BEDTIME AS NEEDED FOR ANXIETY.  . benzonatate (TESSALON) 200 MG capsule TAKE 1 CAPSULE BY MOUTH EVERY DAY 3 TIMES A DAY AS NEEDED FOR COUGH  . fluticasone (FLONASE) 50 MCG/ACT nasal spray SPRAY 2 SPRAYS INTO EACH NOSTRIL EVERY DAY  . [DISCONTINUED] CHANTIX 1 MG tablet TAKE 1 TABLET BY MOUTH TWICE A DAY  . [DISCONTINUED] doxepin (SINEQUAN) 25 MG capsule TAKE 1-2 CAPSULES (25-50 MG TOTAL) BY MOUTH AT BEDTIME AS NEEDED.  . [DISCONTINUED] erythromycin ophthalmic ointment Place a 1/2 inch ribbon of ointment into the lower eyelid.  3 times a day.  . [DISCONTINUED] omeprazole (PRILOSEC) 40 MG capsule Take 1 capsule (40 mg total) by mouth daily.  . [DISCONTINUED] tamsulosin (FLOMAX) 0.4 MG CAPS capsule Take 1 capsule (0.4 mg total) by mouth daily.  . [DISCONTINUED] tamsulosin (FLOMAX) 0.4 MG CAPS capsule TAKE 1 CAPSULE BY MOUTH EVERY DAY     Allergies  Allergen Reactions  . Levofloxacin In D5w Rash    Burning rash in genital area   . Temazepam Other (See Comments)    No help with sleep  . Zolpidem Other (See Comments)  . Ambien [Zolpidem Tartrate]     Sleepwalking  . Aspartame And Phenylalanine   . Codeine Rash and Itching  Review of Systems:  Constitutional: No recent illness  Cardiac: No  chest pain, No  pressure, No palpitations  Respiratory:  No  shortness of breath. No  Cough  Gastrointestinal: +abdominal pain, no change on bowel habits  Musculoskeletal: No new myalgia/arthralgia  Skin: No  Rash  Hem/Onc: No  easy bruising/bleeding, No  abnormal lumps/bumps  Neurologic: No  weakness, No  Dizziness  Psychiatric: No   concerns with depression, +concerns with anxiety, +sleep problem   Exam:  BP 119/74 (BP Location: Left Arm, Patient Position: Sitting, Cuff Size: Small)   Pulse (!) 57   Temp 97.6 F (36.4 C) (Oral)   Wt 122 lb 3.2 oz (55.4 kg)   BMI 17.53 kg/m   Constitutional: VS see above. General Appearance: alert, well-developed, well-nourished, NAD  Eyes: Normal lids and conjunctive, non-icteric sclera  Ears, Nose, Mouth, Throat: MMM, Normal external inspection ears/nares/mouth/lips/gums.  Neck: No masses, trachea midline.   Respiratory: Normal respiratory effort. no wheeze, no rhonchi, no rales  Cardiovascular: S1/S2 normal, no murmur, no rub/gallop auscultated. RRR.   Musculoskeletal: Gait normal. Symmetric and independent movement of all extremities  Neurological: Normal balance/coordination. No tremor.  Skin: warm, dry, intact.   Psychiatric: Normal judgment/insight. Normal mood and affect. Oriented x3.   Visit summary with medication list and pertinent instructions was printed for patient to review, advised to alert [place] if any changes needed. All questions at time of visit were answered - patient instructed to contact office with any additional concerns. ER/RTC precautions were reviewed with the patient and understanding verbalized.   Follow-up plan: Return for annual physical due 08/2018.    Please note: voice recognition software was used to produce this document, and typos may escape review. Please contact Dr. Lyn Hollingshead for any needed clarifications.

## 2018-03-28 ENCOUNTER — Other Ambulatory Visit: Payer: Self-pay | Admitting: Osteopathic Medicine

## 2018-03-28 DIAGNOSIS — K641 Second degree hemorrhoids: Secondary | ICD-10-CM | POA: Insufficient documentation

## 2018-03-28 DIAGNOSIS — G4700 Insomnia, unspecified: Secondary | ICD-10-CM

## 2018-03-28 NOTE — Telephone Encounter (Signed)
Medication was discontinued on 02/28/18. See OV note.

## 2018-04-22 ENCOUNTER — Other Ambulatory Visit: Payer: Self-pay | Admitting: Osteopathic Medicine

## 2018-04-22 DIAGNOSIS — G4700 Insomnia, unspecified: Secondary | ICD-10-CM

## 2018-04-22 DIAGNOSIS — R053 Chronic cough: Secondary | ICD-10-CM

## 2018-04-22 DIAGNOSIS — H2511 Age-related nuclear cataract, right eye: Secondary | ICD-10-CM | POA: Insufficient documentation

## 2018-04-22 DIAGNOSIS — R05 Cough: Secondary | ICD-10-CM

## 2018-04-22 NOTE — Telephone Encounter (Signed)
[facility]am and benzonatate. Pls advise, thanks.

## 2018-04-23 NOTE — Telephone Encounter (Signed)
Pt has been updated. No other inquiries during call.  

## 2018-05-03 ENCOUNTER — Telehealth: Payer: Self-pay

## 2018-05-03 NOTE — Telephone Encounter (Signed)
Pt called - wants to know if provider has rec'd any paperwork to fill out for FMLA. Pls advise, thanks.

## 2018-05-03 NOTE — Telephone Encounter (Signed)
[facility]  I'm not sure why he might need FMLA from me, the last time he and I had a visit 02/28/2018 we discussed diverticulitis issue, if this is for time missed due to that problem? Or other procedure?  If this was for procedure or surgery, the FMLA forms need to go to his surgeon and NOT to me

## 2018-05-03 NOTE — Telephone Encounter (Addendum)
[facility]. He was out of work from 03/24/18 until 03/27/18. Pt has made numerous attempts to have ER doctor fill out the FMLA form, but has not been able to reach the doctor. He was informed by [facility] medical records office that perhaps pt's provider may be able to fill out the form. Surgery was done yesterday for removal of hemorrhoids. Pt is aware that completion of form not guaranteed & provider will be OOO until 05/09/18. Pls advise, thanks.

## 2018-05-04 NOTE — Telephone Encounter (Signed)
Whoever did the surgery should complete forms since I'll be out of the office

## 2018-05-06 NOTE — Telephone Encounter (Signed)
Pt has been updated of provider's note. Will attempt to contact surgeon for form completion. No other inquiries during call.

## 2018-05-08 ENCOUNTER — Other Ambulatory Visit: Payer: Self-pay | Admitting: Osteopathic Medicine

## 2018-05-08 DIAGNOSIS — G4700 Insomnia, unspecified: Secondary | ICD-10-CM

## 2018-07-21 ENCOUNTER — Other Ambulatory Visit: Payer: Self-pay | Admitting: Osteopathic Medicine

## 2018-07-21 DIAGNOSIS — G4700 Insomnia, unspecified: Secondary | ICD-10-CM

## 2018-07-22 NOTE — Telephone Encounter (Signed)
[facility]  

## 2018-07-22 NOTE — Telephone Encounter (Signed)
Pt has been updated of med refill request. No other inquiries during call.

## 2018-07-26 ENCOUNTER — Other Ambulatory Visit: Payer: Self-pay | Admitting: Osteopathic Medicine

## 2018-07-26 DIAGNOSIS — R05 Cough: Secondary | ICD-10-CM

## 2018-07-26 DIAGNOSIS — R053 Chronic cough: Secondary | ICD-10-CM

## 2018-10-14 ENCOUNTER — Other Ambulatory Visit: Payer: Self-pay | Admitting: Osteopathic Medicine

## 2018-10-14 DIAGNOSIS — G4700 Insomnia, unspecified: Secondary | ICD-10-CM

## 2018-10-14 MED ORDER — ALPRAZOLAM 1 MG PO TABS: 1.0000 mg | ORAL_TABLET | Freq: Every evening | ORAL | 0 refills | Status: DC | PRN

## 2018-10-14 NOTE — Telephone Encounter (Signed)
[facility].

## 2018-10-14 NOTE — Telephone Encounter (Signed)
Pt has been updated of med refill sent to local pharmacy. No other inquiries during call.  

## 2018-10-30 ENCOUNTER — Other Ambulatory Visit: Payer: Self-pay | Admitting: Osteopathic Medicine

## 2018-10-30 DIAGNOSIS — R05 Cough: Secondary | ICD-10-CM

## 2018-10-30 DIAGNOSIS — R053 Chronic cough: Secondary | ICD-10-CM

## 2018-11-14 ENCOUNTER — Telehealth: Payer: Self-pay

## 2018-11-14 NOTE — Telephone Encounter (Signed)
Pt called requesting a work note. As per pt, he is having a lot of difficulty in keeping a mask on all day. Pt states that he needs a note from provider addressing COPD/Emphysema issues to be excused from wearing a mask. Pt is ok with provider sending the note through [place]. Pls advise, thanks.

## 2018-11-14 NOTE — Telephone Encounter (Signed)
Can we try to schedule patient for virtual visit to discuss this?  Masks do not affect oxygenation levels, though they can cause some claustrophobia.  I think it is really important that everyone wear masks especially if they any medical comorbidities which would put them at greater risk should they contract a COVID infection.  I can also write a note to excuse him from work altogether due to age and medical problems.

## 2018-11-14 NOTE — Telephone Encounter (Signed)
Called pt, offered appt tomorrow but patient unable to do it then. Pt scheduled for Monday for virtual visit

## 2018-11-18 ENCOUNTER — Ambulatory Visit (INDEPENDENT_AMBULATORY_CARE_PROVIDER_SITE_OTHER): Payer: BC Managed Care – PPO | Admitting: Osteopathic Medicine

## 2018-11-18 ENCOUNTER — Encounter: Payer: Self-pay | Admitting: Osteopathic Medicine

## 2018-11-18 VITALS — Temp 98.0°F

## 2018-11-18 DIAGNOSIS — F411 Generalized anxiety disorder: Secondary | ICD-10-CM

## 2018-11-18 DIAGNOSIS — J439 Emphysema, unspecified: Secondary | ICD-10-CM | POA: Diagnosis not present

## 2018-11-18 DIAGNOSIS — R5383 Other fatigue: Secondary | ICD-10-CM | POA: Diagnosis not present

## 2018-11-18 NOTE — Progress Notes (Signed)
Virtual Visit via Video (App used: Doximity) Note  I connected with      Dylan Riasavid Potter on 11/18/18 at 3:59 PM by a telemedicine application and verified that I am speaking with the correct person using two identifiers.  Patient is at home I am in office    I discussed the limitations of evaluation and management by telemedicine and the availability of in person appointments. The patient expressed understanding and agreed to proceed.  History of Present Illness: Dylan Potter is a 63 y.o. male who would like to discuss mask exemption for work    Employer requires him to wear mask, was initially ok to wear it below his nose but now employer is requiring to cover nose as well/. Pt reports he feels it's difficult to breathe with the full mask on   Pt also reports fatigue worse over the past couple months.    Observations/Objective: Temp 98 F (36.7 C) (Oral)  BP Readings from Last 3 Encounters:  02/28/18 119/74  12/08/17 123/78  10/18/17 131/77   Exam: Normal Speech.  NAD  Lab and Radiology Results No results found for this or any previous visit (from the past 72 hour(s)). No results found.     Assessment and Plan: 63 y.o. male with The primary encounter diagnosis was Pulmonary emphysema, unspecified emphysema type (HCC). Diagnoses of Generalized anxiety disorder and Other fatigue were also pertinent to this visit.  Advised pt I don't think it's wise to NOT wear mask fully over nose and mouth, and he is at risk of spreading COVID or contracting COVID If not wearing mask appropriately. Given history of COPD and anxiety, I think he's more likely claustrophobic/anxious about the mask and this contributed to SOB feeling, I advised him It should not affect his oxygenation levels. I think if he has mask under nose, that's not ideal but it's fine as long as he maintains strict 6 ft distance, and moves mask up over his nose if he's any closer to anyone or feels like he might cough or  sneeze  See letter.   Orders Placed This Encounter  Procedures  . CBC  . COMPLETE METABOLIC PANEL WITH GFR  . Testosterone  . PSA, Total with Reflex to PSA, Free  . TSH  . VITAMIN D 25 Hydroxy (Vit-D Deficiency, Fractures)  . Vitamin B12  . Urinalysis, Routine w reflex microscopic      Follow Up Instructions: No follow-ups on file.    I discussed the assessment and treatment plan with the patient. The patient was provided an opportunity to ask questions and all were answered. The patient agreed with the plan and demonstrated an understanding of the instructions.   The patient was advised to call back or seek an in-person evaluation if any new concerns, if symptoms worsen or if the condition fails to improve as anticipated.  25 minutes of non-face-to-face time was provided during this encounter.                      Historical information moved to improve visibility of documentation.  Past Medical History:  Diagnosis Date  . History of anxiety    Past Surgical History:  Procedure Laterality Date  . DEBRIDEMENT TENNIS ELBOW  2001, 2002  . HERNIA REPAIR  2000   Social History   Tobacco Use  . Smoking status: Current Every Day Smoker    Packs/day: 1.00    Years: 40.00    Pack years: 40.00  Types: Cigarettes  . Smokeless tobacco: Never Used  Substance Use Topics  . Alcohol use: No   family history includes Hyperlipidemia in his mother.  Medications: Current Outpatient Medications  Medication Sig Dispense Refill  . ALPRAZolam (XANAX) 1 MG tablet Take 1-1.5 tablets (1-1.5 mg total) by mouth at bedtime as needed for anxiety or sleep. 135 tablet 0  . benzonatate (TESSALON) 200 MG capsule TAKE 1 CAPSULE BY MOUTH 3 TIMES A DAY AS NEEDED FOR COUGH 30 capsule 2  . fluticasone (FLONASE) 50 MCG/ACT nasal spray SPRAY 2 SPRAYS INTO EACH NOSTRIL EVERY DAY 48 mL 2  . traZODone (DESYREL) 100 MG tablet Take 1 tablet (100 mg total) by mouth at bedtime. 90  tablet 3   No current facility-administered medications for this visit.    Allergies  Allergen Reactions  . Levofloxacin In D5w Rash    Burning rash in genital area   . Temazepam Other (See Comments)    No help with sleep  . Zolpidem Other (See Comments)  . Ambien [Zolpidem Tartrate]     Sleepwalking  . Aspartame And Phenylalanine Nausea Only  . Codeine Rash and Itching    PDMP not reviewed this encounter. No orders of the defined types were placed in this encounter.  No orders of the defined types were placed in this encounter.

## 2018-12-03 LAB — COMPLETE METABOLIC PANEL WITH GFR
AG Ratio: 1.7 (calc) (ref 1.0–2.5)
ALT: 14 U/L (ref 9–46)
AST: 15 U/L (ref 10–35)
Albumin: 4.1 g/dL (ref 3.6–5.1)
Alkaline phosphatase (APISO): 59 U/L (ref 35–144)
BUN: 17 mg/dL (ref 7–25)
CO2: 24 mmol/L (ref 20–32)
Calcium: 9.5 mg/dL (ref 8.6–10.3)
Chloride: 107 mmol/L (ref 98–110)
Creat: 1.24 mg/dL (ref 0.70–1.25)
GFR, Est African American: 72 mL/min/{1.73_m2} (ref >=60)
GFR, Est Non African American: 62 mL/min/{1.73_m2} (ref >=60)
Globulin: 2.4 g/dL (calc) (ref 1.9–3.7)
Glucose, Bld: 107 mg/dL — ABNORMAL HIGH (ref 65–99)
Potassium: 4.1 mmol/L (ref 3.5–5.3)
Sodium: 140 mmol/L (ref 135–146)
Total Bilirubin: 0.3 mg/dL (ref 0.2–1.2)
Total Protein: 6.5 g/dL (ref 6.1–8.1)

## 2018-12-03 LAB — URINALYSIS, ROUTINE W REFLEX MICROSCOPIC
Bilirubin Urine: NEGATIVE
Glucose, UA: NEGATIVE
Hgb urine dipstick: NEGATIVE
Ketones, ur: NEGATIVE
Leukocytes,Ua: NEGATIVE
Nitrite: NEGATIVE
Protein, ur: NEGATIVE
Specific Gravity, Urine: 1.024 (ref 1.001–1.03)
pH: 5.5 (ref 5.0–8.0)

## 2018-12-03 LAB — PSA, TOTAL WITH REFLEX TO PSA, FREE: PSA, Total: 1.2 ng/mL (ref <=4.0)

## 2018-12-03 LAB — CBC
HCT: 47 % (ref 38.5–50.0)
Hemoglobin: 15.8 g/dL (ref 13.2–17.1)
MCH: 32.5 pg (ref 27.0–33.0)
MCHC: 33.6 g/dL (ref 32.0–36.0)
MCV: 96.7 fL (ref 80.0–100.0)
MPV: 10.1 fL (ref 7.5–12.5)
Platelets: 182 10*3/uL (ref 140–400)
RBC: 4.86 10*6/uL (ref 4.20–5.80)
RDW: 13.2 % (ref 11.0–15.0)
WBC: 7.2 10*3/uL (ref 3.8–10.8)

## 2018-12-03 LAB — VITAMIN B12: Vitamin B-12: 1098 pg/mL (ref 200–1100)

## 2018-12-03 LAB — TESTOSTERONE: Testosterone: 751 ng/dL (ref 250–827)

## 2018-12-03 LAB — TSH: TSH: 2.41 mIU/L (ref 0.40–4.50)

## 2018-12-03 LAB — VITAMIN D 25 HYDROXY (VIT D DEFICIENCY, FRACTURES): Vit D, 25-Hydroxy: 46 ng/mL (ref 30–100)

## 2018-12-04 ENCOUNTER — Telehealth: Payer: Self-pay | Admitting: *Deleted

## 2018-12-04 DIAGNOSIS — R7301 Impaired fasting glucose: Secondary | ICD-10-CM

## 2018-12-04 NOTE — Telephone Encounter (Signed)
A1c ordered and faxed to lab for them to add to labs already drawn.

## 2018-12-25 ENCOUNTER — Ambulatory Visit (INDEPENDENT_AMBULATORY_CARE_PROVIDER_SITE_OTHER): Payer: BC Managed Care – PPO | Admitting: Family Medicine

## 2018-12-25 ENCOUNTER — Encounter: Payer: Self-pay | Admitting: Family Medicine

## 2018-12-25 ENCOUNTER — Other Ambulatory Visit: Payer: Self-pay

## 2018-12-25 VITALS — BP 126/84 | HR 81 | Temp 98.4°F | Wt 124.0 lb

## 2018-12-25 DIAGNOSIS — Z566 Other physical and mental strain related to work: Secondary | ICD-10-CM

## 2018-12-25 DIAGNOSIS — F411 Generalized anxiety disorder: Secondary | ICD-10-CM | POA: Diagnosis not present

## 2018-12-25 DIAGNOSIS — F439 Reaction to severe stress, unspecified: Secondary | ICD-10-CM | POA: Diagnosis not present

## 2018-12-25 DIAGNOSIS — F329 Major depressive disorder, single episode, unspecified: Secondary | ICD-10-CM | POA: Diagnosis not present

## 2018-12-25 DIAGNOSIS — R4589 Other symptoms and signs involving emotional state: Secondary | ICD-10-CM

## 2018-12-25 MED ORDER — BUSPIRONE HCL 10 MG PO TABS: 10.0000 mg | ORAL_TABLET | Freq: Three times a day (TID) | ORAL | 1 refills | Status: DC | PRN

## 2018-12-25 MED ORDER — FLUOXETINE HCL 10 MG PO CAPS: ORAL_CAPSULE | ORAL | 0 refills | Status: DC

## 2018-12-25 NOTE — Progress Notes (Signed)
Dylan Potter is a 63 y.o. male who [facility] today for anxiety and depression symptoms.  Patient has a history of anxiety and insomnia.  He has been been previously pretty well controlled with Xanax 1.5 mg at bedtime along with 100 mg of trazodone at bedtime.  He is never been on any SSRIs for anxiety or depression in the past.  He notes lots of increased stressors.  He notes increased stress at work and at home.  His son is constantly in and out of trouble and is a big stress for him.  Additionally at work there is been lots of layoffs and he is working multiple peoples equivalent jobs and barely hanging on.  He notes he feels as though he is going to have a nervous breakdown.  He denies any active suicidal ideation or homicidal ideation.   ROS as above:  Exam:  BP 126/84   Pulse 81   Temp 98.4 F (36.9 C) (Oral)   Wt 124 lb (56.2 kg)   BMI 17.79 kg/m  Wt Readings from Last 5 Encounters:  12/25/18 124 lb (56.2 kg)  02/28/18 122 lb 3.2 oz (55.4 kg)  12/08/17 125 lb (56.7 kg)  10/18/17 126 lb 6.4 oz (57.3 kg)  09/18/17 123 lb 9.6 oz (56.1 kg)    Gen: Well NAD HEENT: EOMI,  MMM Lungs: Normal work of breathing. CTABL Heart: RRR no MRG Abd: NABS, Soft. Nondistended, Nontender Exts: Brisk capillary refill, warm and well perfused.  Psych alert and oriented normal speech thought process and affect.  No active SI or HI expressed.  [facility] 12/25/2018 09/18/2017 02/19/2017 09/05/2016 03/14/2016  Decreased Interest 2 1 0 0 1  Down, Depressed, Hopeless 2 1 0 0 1  PHQ - 2 Score 4 2 0 0 2  Altered sleeping 1 3 0 - 3  Tired, decreased energy 3 3 0 - 3  Change in appetite 1 0 0 - 0  Feeling bad or failure about yourself  2 0 0 - 1  Trouble concentrating 3 0 0 - 1  Moving slowly or fidgety/restless 2 0 0 - 1  Suicidal thoughts 1 0 0 - 0  PHQ-9  Score 17 8 0 - 11  Difficult doing work/chores Very difficult Somewhat difficult - - -   GAD 7 : Generalized Anxiety Score 12/25/2018 09/18/2017 03/14/2016  Nervous, Anxious, on Edge 3 1 2   Control/stop worrying 3 1 1   Worry too much - different things 3 1 1   Trouble relaxing 2 1 3   Restless 2 0 1  Easily annoyed or irritable 1 0 1  Afraid - awful might happen 3 0 0  Total GAD 7 Score 17 4 9   Anxiety Difficulty Very difficult Somewhat difficult Somewhat difficult      Lab and Radiology Results No results found for this or any previous visit (from the past 72 hour(s)). No results found.    Assessment and Plan: 63 y.o. male with worsening anxiety and depressive symptoms secondary to increase stressors.  Lengthy discussion with patient today.  Plan to start Prozac at 10 mg.  Take Prozac 10 mg daily for 1 week and increase to 20 mg in 1 week and follow-up with PCP in 2 weeks for recheck and reevaluation.  Additionally use BuSpar for anxiety during the day as well if needed.  Will also refer to behavioral health for counseling.  Precautions reviewed.  Check back with PCP in near future.  Return sooner if needed.  PDMP reviewed during this encounter. Orders Placed This Encounter  Procedures  . Ambulatory referral to Behavioral Health    Referral Priority:   Routine    Referral Type:   Psychiatric    Referral Reason:   Specialty Services Required    Requested Specialty:   Behavioral Health    Number of Visits Requested:   1   Meds ordered this encounter  Medications  . FLUoxetine (PROZAC) 10 MG capsule    Sig: Take 1 capsule (10 mg total) by mouth daily for 7 days, THEN 2 capsules (20 mg total) daily for 21 days.    Dispense:  49 capsule    Refill:  0  . busPIRone (BUSPAR) 10 MG tablet    Sig: Take 1 tablet (10 mg total) by mouth 3 (three) times daily as needed (anxiety).    Dispense:  90 tablet    Refill:  1     Historical information moved to improve visibility of  documentation.  Past Medical History:  Diagnosis Date  . History of anxiety    Past Surgical History:  Procedure Laterality Date  . DEBRIDEMENT TENNIS ELBOW  2001, 2002  . HERNIA REPAIR  2000   Social History   Tobacco Use  . Smoking status: Current Every Day Smoker    Packs/day: 1.00    Years: 40.00    Pack years: 40.00    Types: Cigarettes  . Smokeless tobacco: Never Used  Substance Use Topics  . Alcohol use: No   family history includes Hyperlipidemia in his mother.  Medications: Current Outpatient Medications  Medication Sig Dispense Refill  . ALPRAZolam (XANAX) 1 MG tablet Take 1-1.5 tablets (1-1.5 mg total) by mouth at bedtime as needed for anxiety or sleep. 135 tablet 0  . benzonatate (TESSALON) 200 MG capsule TAKE 1 CAPSULE BY MOUTH 3 TIMES A DAY AS NEEDED FOR COUGH 30 capsule 2  . fluticasone (FLONASE) 50 MCG/ACT nasal spray SPRAY 2 SPRAYS INTO EACH NOSTRIL EVERY DAY 48 mL 2  . traZODone (DESYREL) 100 MG tablet Take 1 tablet (100 mg total) by mouth at bedtime. 90 tablet 3  . busPIRone (BUSPAR) 10 MG tablet Take 1 tablet (10 mg total) by mouth 3 (three) times daily as needed (anxiety). 90 tablet 1  . FLUoxetine (PROZAC) 10 MG capsule Take 1 capsule (10 mg total) by mouth daily for 7 days, THEN 2 capsules (20 mg total) daily for 21 days. 49 capsule 0   No current facility-administered medications for this visit.    Allergies  Allergen Reactions  . Levofloxacin In D5w Rash    Burning rash in genital area   . Temazepam Other (See Comments)    No help with sleep  . Zolpidem Other (See Comments)  . Ambien [Zolpidem Tartrate]     Sleepwalking  . Aspartame And Phenylalanine Nausea Only  . Codeine Rash and Itching     Discussed warning signs or symptoms. Please see discharge instructions. Patient expresses understanding.

## 2018-12-25 NOTE — Patient Instructions (Signed)
Thank you for coming in today. For anxiety. You should hear about therapy referral.  Let me know if you do not hear anything in 1 week.  Start Fluoxetine (prozac) daily. In 1 week increase to 2 pills by mouth daily. Take buspar up to 3x daily for anxiety as needed.   Recheck with Dr Sheppard Coil in 2 weeks.  Expect some improvement then but I would not be suprised if the depression symptoms are still somewhat present.   If you have problems with your medicine let me know.   Fluoxetine capsules or tablets (Depression/Mood Disorders) What is this medicine? FLUOXETINE (floo OX e teen) belongs to a class of drugs known as selective serotonin reuptake inhibitors (SSRIs). It helps to treat mood problems such as depression, obsessive compulsive disorder, and panic attacks. It can also treat certain eating disorders. This medicine may be used for other purposes; ask your health care provider or pharmacist if you have questions. COMMON BRAND NAME(S): Prozac What should I tell my health care provider before I take this medicine? They need to know if you have any of these conditions:  bipolar disorder or a family history of bipolar disorder  bleeding disorders  glaucoma  heart disease  liver disease  low levels of sodium in the blood  seizures  suicidal thoughts, plans, or attempt; a previous suicide attempt by you or a family member  take MAOIs like Carbex, Eldepryl, Marplan, Nardil, and Parnate  take medicines that treat or prevent blood clots  thyroid disease  an unusual or allergic reaction to fluoxetine, other medicines, foods, dyes, or preservatives  pregnant or trying to get pregnant  breast-feeding How should I use this medicine? Take this medicine by mouth with a glass of water. Follow the directions on the prescription label. You can take this medicine with or without food. Take your medicine at regular intervals. Do not take it more often than directed. Do not stop taking  this medicine suddenly except upon the advice of your doctor. Stopping this medicine too quickly may cause serious side effects or your condition may worsen. A special MedGuide will be given to you by the pharmacist with each prescription and refill. Be sure to read this information carefully each time. Talk to your pediatrician regarding the use of this medicine in children. While this drug may be prescribed for children as young as 7 years for selected conditions, precautions do apply. Overdosage: If you think you have taken too much of this medicine contact a poison control center or emergency room at once. NOTE: This medicine is only for you. Do not share this medicine with others. What if I miss a dose? If you miss a dose, skip the missed dose and go back to your regular dosing schedule. Do not take double or extra doses. What may interact with this medicine? Do not take this medicine with any of the following medications:  other medicines containing fluoxetine, like Sarafem or Symbyax  cisapride  dronedarone  linezolid  MAOIs like Carbex, Eldepryl, Marplan, Nardil, and Parnate  methylene blue (injected into a vein)  pimozide  thioridazine This medicine may also interact with the following medications:  alcohol  amphetamines  aspirin and aspirin-like medicines  carbamazepine  certain medicines for depression, anxiety, or psychotic disturbances  certain medicines for migraine headaches like almotriptan, eletriptan, frovatriptan, naratriptan, rizatriptan, sumatriptan, zolmitriptan  digoxin  diuretics  fentanyl  flecainide  furazolidone  isoniazid  lithium  medicines for sleep  medicines that treat or prevent  blood clots like warfarin, enoxaparin, and dalteparin  NSAIDs, medicines for pain and inflammation, like ibuprofen or naproxen  other medicines that prolong the QT interval (an abnormal heart  rhythm)  phenytoin  procarbazine  propafenone  rasagiline  ritonavir  supplements like St. John's wort, kava kava, valerian  tramadol  tryptophan  vinblastine This list may not describe all possible interactions. Give your health care provider a list of all the medicines, herbs, non-prescription drugs, or dietary supplements you use. Also tell them if you smoke, drink alcohol, or use illegal drugs. Some items may interact with your medicine. What should I watch for while using this medicine? Tell your doctor if your symptoms do not get better or if they get worse. Visit your doctor or health care professional for regular checks on your progress. Because it may take several weeks to see the full effects of this medicine, it is important to continue your treatment as prescribed by your doctor. Patients and their families should watch out for new or worsening thoughts of suicide or depression. Also watch out for sudden changes in feelings such as feeling anxious, agitated, panicky, irritable, hostile, aggressive, impulsive, severely restless, overly excited and hyperactive, or not being able to sleep. If this happens, especially at the beginning of treatment or after a change in dose, call your health care professional. Bonita QuinYou may get drowsy or dizzy. Do not drive, use machinery, or do anything that needs mental alertness until you know how this medicine affects you. Do not stand or sit up quickly, especially if you are an older patient. This reduces the risk of dizzy or fainting spells. Alcohol may interfere with the effect of this medicine. Avoid alcoholic drinks. Your mouth may get dry. Chewing sugarless gum or sucking hard candy, and drinking plenty of water may help. Contact your doctor if the problem does not go away or is severe. This medicine may affect blood sugar levels. If you have diabetes, check with your doctor or health care professional before you change your diet or the dose of  your diabetic medicine. What side effects may I notice from receiving this medicine? Side effects that you should report to your doctor or health care professional as soon as possible:  allergic reactions like skin rash, itching or hives, swelling of the face, lips, or tongue  anxious  black, tarry stools  breathing problems  changes in vision  confusion  elevated mood, decreased need for sleep, racing thoughts, impulsive behavior  eye pain  fast, irregular heartbeat  feeling faint or lightheaded, falls  feeling agitated, angry, or irritable  hallucination, loss of contact with reality  loss of balance or coordination  loss of memory  painful or prolonged erections  restlessness, pacing, inability to keep still  seizures  stiff muscles  suicidal thoughts or other mood changes  trouble sleeping  unusual bleeding or bruising  unusually weak or tired  vomiting Side effects that usually do not require medical attention (report to your doctor or health care professional if they continue or are bothersome):  change in appetite or weight  change in sex drive or performance  diarrhea  dry mouth  headache  increased sweating  nausea  tremors This list may not describe all possible side effects. Call your doctor for medical advice about side effects. You may report side effects to FDA at 1-800-FDA-1088. Where should I keep my medicine? Keep out of the reach of children. Store at room temperature between 15 and 30 degrees C (  59 and 86 degrees F). Throw away any unused medicine after the expiration date. NOTE: This sheet is a summary. It may not cover all possible information. If you have questions about this medicine, talk to your doctor, pharmacist, or health care provider.  2020 Elsevier/Gold Standard (2017-12-13 11:56:53)    Buspirone tablets What is this medicine? BUSPIRONE (byoo SPYE rone) is used to treat anxiety disorders. This medicine may  be used for other purposes; ask your health care provider or pharmacist if you have questions. COMMON BRAND NAME(S): BuSpar What should I tell my health care provider before I take this medicine? They need to know if you have any of these conditions:  kidney or liver disease  an unusual or allergic reaction to buspirone, other medicines, foods, dyes, or preservatives  pregnant or trying to get pregnant  breast-feeding How should I use this medicine? Take this medicine by mouth with a glass of water. Follow the directions on the prescription label. You may take this medicine with or without food. To ensure that this medicine always works the same way for you, you should take it either always with or always without food. Take your doses at regular intervals. Do not take your medicine more often than directed. Do not stop taking except on the advice of your doctor or health care professional. Talk to your pediatrician regarding the use of this medicine in children. Special care may be needed. Overdosage: If you think you have taken too much of this medicine contact a poison control center or emergency room at once. NOTE: This medicine is only for you. Do not share this medicine with others. What if I miss a dose? If you miss a dose, take it as soon as you can. If it is almost time for your next dose, take only that dose. Do not take double or extra doses. What may interact with this medicine? Do not take this medicine with any of the following medications:  linezolid  MAOIs like Carbex, Eldepryl, Marplan, Nardil, and Parnate  methylene blue  procarbazine This medicine may also interact with the following medications:  diazepam  digoxin  diltiazem  erythromycin  grapefruit juice  haloperidol  medicines for mental depression or mood problems  medicines for seizures like carbamazepine, phenobarbital and phenytoin  nefazodone  other medications for  anxiety  rifampin  ritonavir  some antifungal medicines like itraconazole, ketoconazole, and voriconazole  verapamil  warfarin This list may not describe all possible interactions. Give your health care provider a list of all the medicines, herbs, non-prescription drugs, or dietary supplements you use. Also tell them if you smoke, drink alcohol, or use illegal drugs. Some items may interact with your medicine. What should I watch for while using this medicine? Visit your doctor or health care professional for regular checks on your progress. It may take 1 to 2 weeks before your anxiety gets better. You may get drowsy or dizzy. Do not drive, use machinery, or do anything that needs mental alertness until you know how this drug affects you. Do not stand or sit up quickly, especially if you are an older patient. This reduces the risk of dizzy or fainting spells. Alcohol can make you more drowsy and dizzy. Avoid alcoholic drinks. What side effects may I notice from receiving this medicine? Side effects that you should report to your doctor or health care professional as soon as possible:  blurred vision or other vision changes  chest pain  confusion  difficulty breathing  feelings of hostility or anger  muscle aches and pains  numbness or tingling in hands or feet  ringing in the ears  skin rash and itching  vomiting  weakness Side effects that usually do not require medical attention (report to your doctor or health care professional if they continue or are bothersome):  disturbed dreams, nightmares  headache  nausea  restlessness or nervousness  sore throat and nasal congestion  stomach upset This list may not describe all possible side effects. Call your doctor for medical advice about side effects. You may report side effects to FDA at 1-800-FDA-1088. Where should I keep my medicine? Keep out of the reach of children. Store at room temperature below 30 degrees C  (86 degrees F). Protect from light. Keep container tightly closed. Throw away any unused medicine after the expiration date. NOTE: This sheet is a summary. It may not cover all possible information. If you have questions about this medicine, talk to your doctor, pharmacist, or health care provider.  2020 Elsevier/Gold Standard (2009-12-02 18:06:11)

## 2019-01-08 ENCOUNTER — Encounter: Payer: Self-pay | Admitting: Osteopathic Medicine

## 2019-01-08 ENCOUNTER — Ambulatory Visit (INDEPENDENT_AMBULATORY_CARE_PROVIDER_SITE_OTHER): Payer: BC Managed Care – PPO | Admitting: Osteopathic Medicine

## 2019-01-08 ENCOUNTER — Other Ambulatory Visit: Payer: Self-pay

## 2019-01-08 VITALS — BP 130/86 | HR 66 | Temp 98.2°F | Wt 128.7 lb

## 2019-01-08 DIAGNOSIS — F411 Generalized anxiety disorder: Secondary | ICD-10-CM

## 2019-01-08 DIAGNOSIS — F329 Major depressive disorder, single episode, unspecified: Secondary | ICD-10-CM

## 2019-01-08 DIAGNOSIS — R4589 Other symptoms and signs involving emotional state: Secondary | ICD-10-CM

## 2019-01-08 DIAGNOSIS — Z23 Encounter for immunization: Secondary | ICD-10-CM

## 2019-01-08 NOTE — Patient Instructions (Signed)
Will tay at 10 mg dose for now Recheck w/ me in 4 weeks (6 weeks on medicine total) Ask supervisor / HR about leave of absence if needed o reduced work hours/duties.

## 2019-01-08 NOTE — Progress Notes (Signed)
HPI: Dylan Potter is a 63 y.o. male who  has a past medical history of History of anxiety.  he presents to [facility] today, 01/08/19,  for chief complaint of:  Mood follow-up   Recently saw Dr Georgina Snell 2 weeks ago, who was able to start Prozac 10 mg to increase to 20 mg after 7 days. Also started Buspar 10 mg tid as needed.   Rerpots medications are not making much difference, he feels it's more difficult to sleep. Taking Prozac in AM   Depression screen [facility] 2/9 01/08/2019 12/25/2018 09/18/2017  Decreased Interest 2 2 1   Down, Depressed, Hopeless 2 2 1   PHQ - 2 Score 4 4 2   Altered sleeping 2 1 3   Tired, decreased energy 2 3 3   Change in appetite 0 1 0  Feeling bad or failure about yourself  0 2 0  Trouble concentrating 1 3 0  Moving slowly or fidgety/restless 0 2 0  Suicidal thoughts 0 1 0  PHQ-9 Score 9 17 8   Difficult doing work/chores Somewhat difficult Very difficult Somewhat difficult   GAD 7 : Generalized Anxiety Score 01/08/2019 12/25/2018 09/18/2017 03/14/2016  Nervous, Anxious, on Edge 3 3 1 2   Control/stop worrying 1 3 1 1   Worry too much - different things 1 3 1 1   Trouble relaxing 1 2 1 3   Restless 1 2 0 1  Easily annoyed or irritable 2 1 0 1  Afraid - awful might happen 3 3 0 0  Total GAD 7 Score 12 17 4 9   Anxiety Difficulty Very difficult Very difficult Somewhat difficult Somewhat difficult       At today's visit 01/08/19 ... PMH, PSH, FH reviewed and updated as needed.  Current medication list and allergy/intolerance hx reviewed and updated as needed. (See remainder of HPI, ROS, Phys Exam below)   No results found.  No results found for this or any previous visit (from the past 72 hour(s)).        ASSESSMENT/PLAN: The primary encounter diagnosis was Generalized anxiety disorder. Diagnoses of Need for influenza vaccination, Need for Tdap vaccination, and Symptoms of depression were also pertinent to this  visit.   Orders Placed This Encounter  Procedures  . Tdap vaccine greater than or equal to 7yo IM  . Flu Vaccine QUAD High Dose(Fluad)       Patient Instructions  Will tay at 10 mg dose for now Recheck w/ me in 4 weeks (6 weeks on medicine total) Ask supervisor / HR about leave of absence if needed o reduced work hours/duties.       Follow-up plan: Return in about 4 weeks (around 02/05/2019) for phone/virtual visit to recheck anxiety - see me or call me sooner if needed! .                                                 ################################################# ################################################# ################################################# #################################################    Current Meds  Medication Sig  . ALPRAZolam (XANAX) 1 MG tablet Take 1-1.5 tablets (1-1.5 mg total) by mouth at bedtime as needed for anxiety or sleep.  . benzonatate (TESSALON) 200 MG capsule TAKE 1 CAPSULE BY MOUTH 3 TIMES A DAY AS NEEDED FOR COUGH  . busPIRone (BUSPAR) 10 MG tablet Take 1 tablet (10 mg total) by mouth 3 (three) times daily as needed (anxiety).  [name]  FLUoxetine (PROZAC) 10 MG capsule Take 1 capsule (10 mg total) by mouth daily for 7 days, THEN 2 capsules (20 mg total) daily for 21 days.  . fluticasone (FLONASE) 50 MCG/ACT nasal spray SPRAY 2 SPRAYS INTO EACH NOSTRIL EVERY DAY  . traZODone (DESYREL) 100 MG tablet Take 1 tablet (100 mg total) by mouth at bedtime.    Allergies  Allergen Reactions  . Levofloxacin In D5w Rash    Burning rash in genital area   . Temazepam Other (See Comments)    No help with sleep  . Zolpidem Other (See Comments)  . Ambien [Zolpidem Tartrate]     Sleepwalking  . Aspartame And Phenylalanine Nausea Only  . Codeine Rash and Itching       Review of Systems:  Constitutional: No recent illness  HEENT: No  headache, no vision change  Cardiac: No  chest pain,  No  pressure, No palpitations  Respiratory:  No  shortness of breath. No  Cough  Musculoskeletal: No new myalgia/arthralgia  Skin: No  Rash  Neurologic: No  weakness, No  Dizziness  Psychiatric: +concerns with depression, +concerns with anxiety  Exam:  BP 130/86 (BP Location: Left Arm, Patient Position: Sitting, Cuff Size: Normal)   Pulse 66   Temp 98.2 F (36.8 C) (Oral)   Wt 128 lb 11.2 oz (58.4 kg)   BMI 18.47 kg/m   Constitutional: VS see above. General Appearance: alert, well-developed, well-nourished, NAD  Eyes: Normal lids and conjunctive, non-icteric sclera  Neck: No masses, trachea midline.   Respiratory: Normal respiratory effort.   Musculoskeletal: Gait normal. Symmetric and independent movement of all extremities  Neurological: Normal balance/coordination. No tremor.  Skin: warm, dry, intact.   Psychiatric: Normal judgment/insight. Normal mood and affect. Oriented x3.       Visit summary with medication list and pertinent instructions was printed for patient to review, patient was advised to alert us if any updates are needed. All questions [place] time of visit were answered - patient instructed to contact office with any additional concerns. ER/RTC precautions were reviewed with the patient and understanding verbalized.   Note: Total time spent 25 minutes, greater than 50% of the visit was spent face-to-face counseling and coordinating care for the following: The primary encounter diagnosis was Need for influenza vaccination. A diagnosis of Need for Tdap vaccination was also pertinent to this visit.[name].  Please note: voice recognition software was used to produce this document, and typos may escape review. Please contact Dr. Lyn HollingsheadAlexander for any needed clarifications.    Follow up plan: Return in about 4 weeks (around 02/05/2019) for phone/virtual visit to recheck anxiety - see me or call me sooner if needed! .[name]

## 2019-01-14 ENCOUNTER — Other Ambulatory Visit: Payer: Self-pay | Admitting: Osteopathic Medicine

## 2019-01-14 ENCOUNTER — Other Ambulatory Visit: Payer: Self-pay | Admitting: Family Medicine

## 2019-01-14 DIAGNOSIS — G4700 Insomnia, unspecified: Secondary | ICD-10-CM

## 2019-01-15 ENCOUNTER — Ambulatory Visit: Payer: BC Managed Care – PPO | Admitting: Psychology

## 2019-01-16 ENCOUNTER — Other Ambulatory Visit: Payer: Self-pay | Admitting: Family Medicine

## 2019-02-06 ENCOUNTER — Ambulatory Visit (INDEPENDENT_AMBULATORY_CARE_PROVIDER_SITE_OTHER): Payer: BC Managed Care – PPO | Admitting: Physician Assistant

## 2019-02-06 ENCOUNTER — Encounter: Payer: Self-pay | Admitting: Physician Assistant

## 2019-02-06 VITALS — Temp 99.5°F | Wt 130.0 lb

## 2019-02-06 DIAGNOSIS — R053 Chronic cough: Secondary | ICD-10-CM

## 2019-02-06 DIAGNOSIS — B349 Viral infection, unspecified: Secondary | ICD-10-CM | POA: Diagnosis not present

## 2019-02-06 DIAGNOSIS — J439 Emphysema, unspecified: Secondary | ICD-10-CM

## 2019-02-06 DIAGNOSIS — J441 Chronic obstructive pulmonary disease with (acute) exacerbation: Secondary | ICD-10-CM

## 2019-02-06 DIAGNOSIS — R05 Cough: Secondary | ICD-10-CM | POA: Diagnosis not present

## 2019-02-06 MED ORDER — AZITHROMYCIN 250 MG PO TABS: ORAL_TABLET | ORAL | 0 refills | Status: DC

## 2019-02-06 MED ORDER — BENZONATATE 200 MG PO CAPS: ORAL_CAPSULE | ORAL | 2 refills | Status: DC

## 2019-02-06 MED ORDER — PREDNISONE 50 MG PO TABS: ORAL_TABLET | ORAL | 0 refills | Status: DC

## 2019-02-06 NOTE — Progress Notes (Deleted)
Headaches, difficulty breathing, Mostly dry cough. SX since Tuesday. Body aches, temp running 99.5.   (Notes he self-discontinued Prozac due to headaches and body aches 4 weeks ago. Still taking Buspar. No current complaints/needs mas far as mood. Will follow up with PCP about this)

## 2019-02-06 NOTE — Progress Notes (Signed)
..Virtual Visit via Telephone Note  I connected with Dylan Potter on 02/06/19 at 11:30 AM EDT by telephone and verified that I am speaking with the correct person using two identifiers.  Location: Patient: home Provider: clinic   I discussed the limitations, risks, security and privacy concerns of performing an evaluation and management service by telephone and the availability of in person appointments. I also discussed with the patient that there may be a patient responsible charge related to this service. The patient expressed understanding and agreed to proceed.   History of Present Illness: Pt is a 63 yo male with emphysema and chronic cough who calls into the clinic with worsening cough and SOB over the last few days. Pt continues to smoke. He uses tessalon pearls for his ongoing cough. He is not on a daily medication to help with his COPD. He does have albuterol which helps but now he is using much more. On average recently using 3-4 times day. He reports body aches for last few days. He has had a low grade fever. He has had some loss of smell. No GI symptoms. No direct covid contact. No other sick contacts as he lives alone. He has tried some "cold meds over the counter".   .. Active Ambulatory Problems    Diagnosis Date Noted  . History of anxiety   . Generalized anxiety disorder 12/27/2012  . Esophageal stricture 03/31/2013  . Insomnia 07/01/2013  . Hematuria 12/19/2013  . Tobacco abuse disorder 09/05/2016  . Other fatigue 09/05/2016  . Emphysema of lung (HCC) 02/28/2018  . Pulmonary infiltrate 11/01/2017  . Depression 02/28/2018  . Cigarette nicotine dependence without complication 11/01/2017  . Benign prostatic hyperplasia 11/24/2010  . Diverticulosis 02/28/2018  . Right renal stone 02/28/2018   Resolved Ambulatory Problems    Diagnosis Date Noted  . No Resolved Ambulatory Problems   No Additional Past Medical History   Reviewed med, allergy, problem list.     Observations/Objective: NO acute distress. Productive cough on phone.  No labored breathing.   .. Today's Vitals   02/06/19 1119  Temp: 99.5 F (37.5 C)  TempSrc: Oral  Weight: 130 lb (59 kg)   Body mass index is 18.65 kg/m.    Assessment and Plan: Marland KitchenMarland KitchenDiagnoses and all orders for this visit:  Viral syndrome  Persistent cough for 3 weeks or longer -     benzonatate (TESSALON) 200 MG capsule; TAKE 1 CAPSULE BY MOUTH 3 TIMES A DAY AS NEEDED FOR COUGH  Pulmonary emphysema, unspecified emphysema type (HCC)  COPD exacerbation (HCC) -     predniSONE (DELTASONE) 50 MG tablet; Take one tablet for 5 days. -     azithromycin (ZITHROMAX) 250 MG tablet; Take 2 tablets now and then one tablet for 4 days.   Certainly has some signs and symptoms of COVID.  I would like for him to get tested at our [facility] location today.  Patient was instructed to self isolate until test results come back.  Certainly patient has a higher risk of complications to COVID due to his emphysema.  He does feel like he is also an exacerbation of his COPD.  We will go ahead and treat with Z-Pak and prednisone.  Continue to use albuterol every 2-4 hours as needed. Rest and hydrate. Go to ED with any respiratory distress or rapidly worsening symptoms. Call office as needed.    Follow Up Instructions:    I discussed the assessment and treatment plan with the patient. The patient  was provided an opportunity to ask questions and all were answered. The patient agreed with the plan and demonstrated an understanding of the instructions.   The patient was advised to call back or seek an in-person evaluation if the symptoms worsen or if the condition fails to improve as anticipated.  I provided 11 minutes of non-face-to-face time during this encounter.   Iran Planas, PA-C

## 2019-02-10 ENCOUNTER — Telehealth: Payer: Self-pay

## 2019-02-10 NOTE — Telephone Encounter (Signed)
Pt called requesting a work note. As per pt, he was recently tested for Covid. Results were negative. Pt did return to work, but employer needs letter of clearance. Jye is requesting if provider can send the note through [employer] by this afternoon. Pls advise, thanks.

## 2019-02-10 NOTE — Telephone Encounter (Signed)
Looks like Jade saw him fo rlikely COPD exacerbation, I don't see results from COVID testing. I wrote a letter based on my review of records, hopefully it's sufficient.

## 2019-02-11 NOTE — Telephone Encounter (Signed)
Pt has been updated regarding provider's note and letter. As per pt, letter was given to his employer today. Thus far, no issue. Pt will call us if the letter needs to be amended. No other inquiries during call.

## 2019-02-17 ENCOUNTER — Ambulatory Visit: Payer: BC Managed Care – PPO | Admitting: Osteopathic Medicine

## 2019-04-13 ENCOUNTER — Other Ambulatory Visit: Payer: Self-pay | Admitting: Osteopathic Medicine

## 2019-04-13 DIAGNOSIS — G4700 Insomnia, unspecified: Secondary | ICD-10-CM

## 2019-04-30 ENCOUNTER — Other Ambulatory Visit: Payer: Self-pay | Admitting: Osteopathic Medicine

## 2019-04-30 DIAGNOSIS — G4700 Insomnia, unspecified: Secondary | ICD-10-CM

## 2019-05-25 ENCOUNTER — Other Ambulatory Visit: Payer: Self-pay | Admitting: Osteopathic Medicine

## 2019-05-25 DIAGNOSIS — G4700 Insomnia, unspecified: Secondary | ICD-10-CM

## 2019-06-17 ENCOUNTER — Telehealth: Payer: BC Managed Care – PPO | Admitting: Osteopathic Medicine

## 2019-06-17 ENCOUNTER — Other Ambulatory Visit: Payer: Self-pay | Admitting: Osteopathic Medicine

## 2019-06-17 DIAGNOSIS — G4700 Insomnia, unspecified: Secondary | ICD-10-CM

## 2019-06-24 MED ORDER — NITROGLYCERIN 0.4 MG SL SUBL
0.40 | SUBLINGUAL_TABLET | SUBLINGUAL | Status: DC
Start: ? — End: 2019-06-24

## 2019-06-25 ENCOUNTER — Ambulatory Visit: Payer: BC Managed Care – PPO | Admitting: Osteopathic Medicine

## 2019-06-28 ENCOUNTER — Other Ambulatory Visit: Payer: Self-pay | Admitting: Osteopathic Medicine

## 2019-06-28 DIAGNOSIS — G4700 Insomnia, unspecified: Secondary | ICD-10-CM

## 2019-07-08 ENCOUNTER — Other Ambulatory Visit: Payer: Self-pay | Admitting: Osteopathic Medicine

## 2019-07-09 ENCOUNTER — Other Ambulatory Visit: Payer: Self-pay | Admitting: Osteopathic Medicine

## 2019-07-09 DIAGNOSIS — G4700 Insomnia, unspecified: Secondary | ICD-10-CM

## 2019-07-09 NOTE — Telephone Encounter (Signed)
Requested  medications are  due for refill today 3/7  Requested medications are on the active medication list yes  Last refill 04/14/2019  Future visit scheduled 07/17/2019  Notes to clinic This drug is not delegated.

## 2019-07-09 NOTE — Telephone Encounter (Signed)
Requested  medications are  due for refill today yes (3/7)  Requested medications are on the active medication list yes  Last refill 12/7  Future visit scheduled 07/17/2019  Notes to clinic This drug is not delegated.

## 2019-07-17 ENCOUNTER — Ambulatory Visit (INDEPENDENT_AMBULATORY_CARE_PROVIDER_SITE_OTHER): Payer: BC Managed Care – PPO | Admitting: Osteopathic Medicine

## 2019-07-17 ENCOUNTER — Other Ambulatory Visit: Payer: Self-pay

## 2019-07-17 ENCOUNTER — Encounter: Payer: Self-pay | Admitting: Osteopathic Medicine

## 2019-07-17 DIAGNOSIS — G4700 Insomnia, unspecified: Secondary | ICD-10-CM

## 2019-07-17 MED ORDER — TRAZODONE HCL 100 MG PO TABS: 100.0000 mg | ORAL_TABLET | Freq: Every day | ORAL | 3 refills | Status: DC

## 2019-07-17 MED ORDER — BUSPIRONE HCL 10 MG PO TABS: ORAL_TABLET | ORAL | 3 refills | Status: DC

## 2019-07-17 MED ORDER — TAMSULOSIN HCL 0.4 MG PO CAPS: 0.4000 mg | ORAL_CAPSULE | Freq: Every day | ORAL | 1 refills | Status: DC

## 2019-07-17 NOTE — Progress Notes (Signed)
Dylan Potter is a 64 y.o. male who presents to  [facility] at [facility]  today, seeking care for the following: [name] Med refills - anxiety and insomnia are stable  . [place] re cardiology and urology - following for stress test, BPH respectively  . Otherwise doing well      ASSESSMENT & PLAN with other pertinent history/findings:  Diagnoses of Insomnia, unspecified type and Insomnia, unspecified type were pertinent to this visit.    Meds ordered this encounter  Medications  . traZODone (DESYREL) 100 MG tablet    Sig: Take 1 tablet (100 mg total) by mouth at bedtime.    Dispense:  90 tablet    Refill:  3    No refills. Needs virtual visit w/PCP.  [name] tamsulosin (FLOMAX) 0.4 MG CAPS capsule    Sig: Take 1 capsule (0.4 mg total) by mouth daily.    Dispense:  90 capsule    Refill:  1  . busPIRone (BUSPAR) 10 MG tablet    Sig: TAKE 1 TABLET BY MOUTH 3 TIMES A DAY AS NEEDED FOR ANXIETY    Dispense:  270 tablet    Refill:  3    Needs appt before next refill for f/u on anxiety.       Follow-up instructions: Return in about 6 months (around 01/17/2020) for [place] (call week prior to visit for lab orders).                                         BP 131/76 (BP Location: Left Arm, Patient Position: Sitting, Cuff Size: Normal)   Pulse 70   Temp 98.3 F (36.8 C) (Oral)   Wt 131 lb 1.9 oz (59.5 kg)   BMI 18.81 kg/m   Current Meds  Medication Sig  . ALPRAZolam (XANAX) 1 MG tablet TAKE 1-1.5 TABLETS (1-1.5 MG TOTAL) BY MOUTH AT BEDTIME AS NEEDED FOR ANXIETY OR SLEEP.  . busPIRone (BUSPAR) 10 MG tablet TAKE 1 TABLET BY MOUTH 3 TIMES A DAY AS NEEDED FOR ANXIETY  . fluticasone (FLONASE) 50 MCG/ACT nasal spray SPRAY 2 SPRAYS INTO EACH NOSTRIL EVERY DAY  . tamsulosin (FLOMAX) 0.4 MG CAPS capsule Take 1 capsule (0.4 mg total) by mouth daily.  . traZODone (DESYREL) 100 MG tablet Take 1 tablet (100 mg total) by  mouth at bedtime.  . [DISCONTINUED] benzonatate (TESSALON) 200 MG capsule TAKE 1 CAPSULE BY MOUTH 3 TIMES A DAY AS NEEDED FOR COUGH  . [DISCONTINUED] busPIRone (BUSPAR) 10 MG tablet TAKE 1 TABLET BY MOUTH 3 TIMES A DAY AS NEEDED FOR ANXIETY  . [DISCONTINUED] tamsulosin (FLOMAX) 0.4 MG CAPS capsule Take by mouth.  . [DISCONTINUED] traZODone (DESYREL) 100 MG tablet TAKE 1 TABLET (100 MG TOTAL) BY MOUTH AT BEDTIME. NEEDS APPT    No results found for this or any previous visit (from the past 72 hour(s)).  No results found.  Depression screen [facility] 2/9 01/08/2019 12/25/2018 09/18/2017  Decreased Interest 2 2 1   Down, Depressed, Hopeless 2 2 1   PHQ - 2 Score 4 4 2   Altered sleeping 2 1 3   Tired, decreased energy 2 3 3   Change in appetite 0 1 0  Feeling bad or failure about yourself  0 2 0  Trouble concentrating 1 3 0  Moving slowly or fidgety/restless 0 2 0  Suicidal thoughts 0 1 0  PHQ-9 Score  9 17 8   Difficult doing work/chores Somewhat difficult Very difficult Somewhat difficult    GAD 7 : Generalized Anxiety Score 01/08/2019 12/25/2018 09/18/2017 03/14/2016  Nervous, Anxious, on Edge 3 3 1 2   Control/stop worrying 1 3 1 1   Worry too much - different things 1 3 1 1   Trouble relaxing 1 2 1 3   Restless 1 2 0 1  Easily annoyed or irritable 2 1 0 1  Afraid - awful might happen 3 3 0 0  Total GAD 7 Score 12 17 4 9   Anxiety Difficulty Very difficult Very difficult Somewhat difficult Somewhat difficult      All questions at time of visit were answered - patient instructed to contact office with any additional concerns or updates.  ER/RTC precautions were reviewed with the patient.  Please note: voice recognition software was used to produce this document, and typos may escape review. Please contact Dr. Sheppard Coil for any needed clarifications.

## 2019-07-26 ENCOUNTER — Other Ambulatory Visit: Payer: Self-pay | Admitting: Osteopathic Medicine

## 2019-07-26 ENCOUNTER — Other Ambulatory Visit: Payer: Self-pay | Admitting: Physician Assistant

## 2019-07-26 DIAGNOSIS — R053 Chronic cough: Secondary | ICD-10-CM

## 2019-07-26 DIAGNOSIS — R05 Cough: Secondary | ICD-10-CM

## 2019-10-09 ENCOUNTER — Other Ambulatory Visit: Payer: Self-pay | Admitting: Osteopathic Medicine

## 2019-10-09 DIAGNOSIS — G4700 Insomnia, unspecified: Secondary | ICD-10-CM

## 2020-01-04 IMAGING — CT CT CHEST W/ CM
2 of 3 series · 15 of 36 positions shown, 18 images · IV contrast (iopamidol)
Comparison: Chest x-ray from same day.

CLINICAL DATA: Fever and night sweats with hemoptysis.

EXAM:
CT CHEST WITH CONTRAST
TECHNIQUE: Multidetector CT imaging of the chest was performed during
intravenous contrast administration.
CONTRAST:  75mL FTOSZG-Y11 IOPAMIDOL (FTOSZG-Y11) INJECTION 61%

[Series 2: axial st · axial · 0.72mm/px · z∈[-326,-16]mm · 12 of 74 slices shown, 15 images]
[im 6/74  mediastinal]
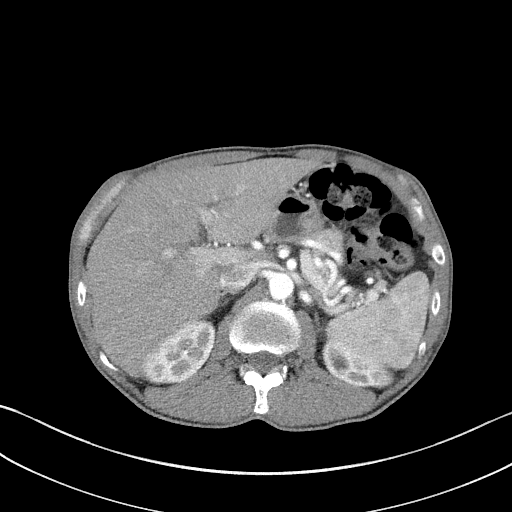
[im 6/74  lung]
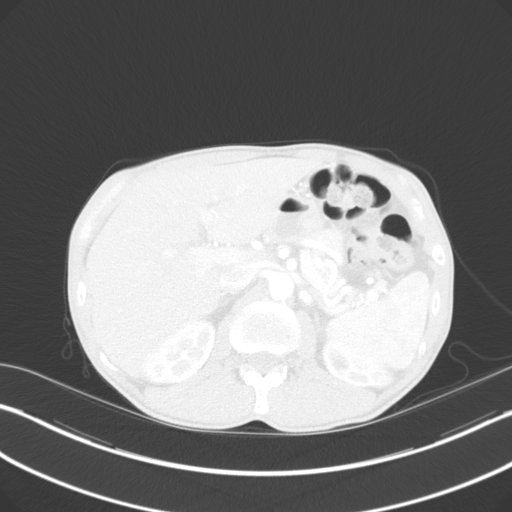
[im 11/74  lung]
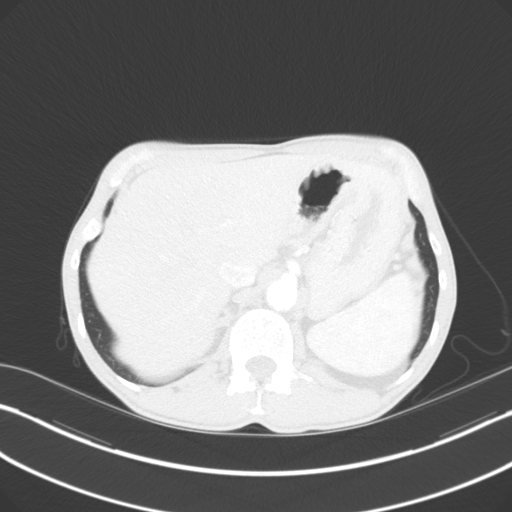
[im 17/74  lung]
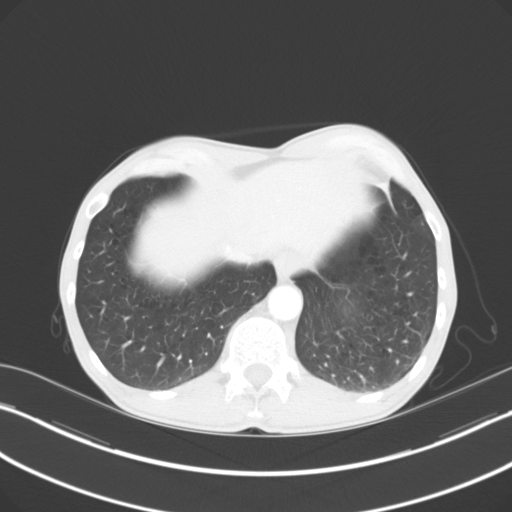
[im 22/74  lung]
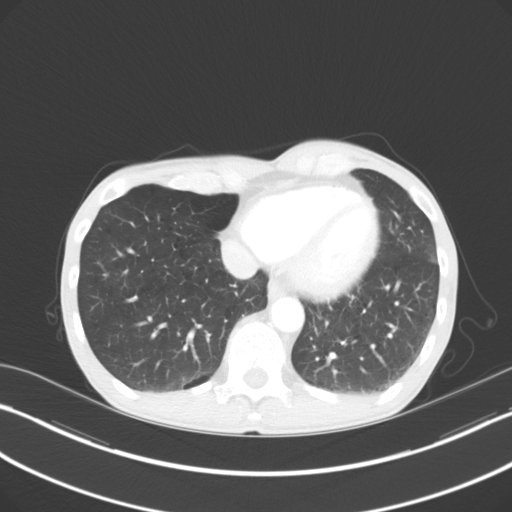
[im 28/74  mediastinal]
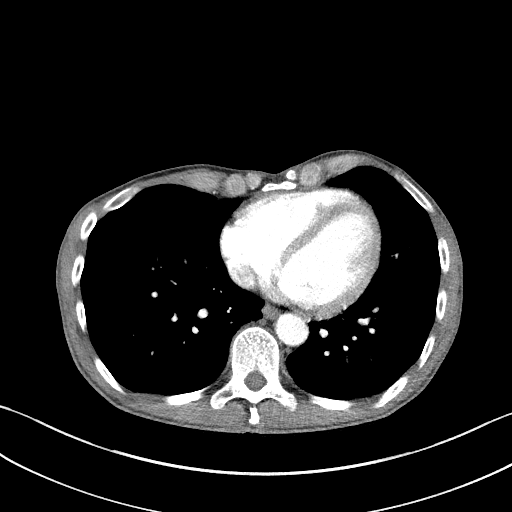
[im 28/74  lung]
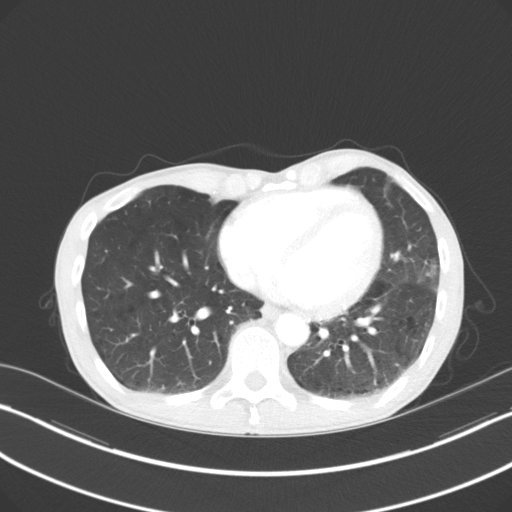
[im 33/74  lung]
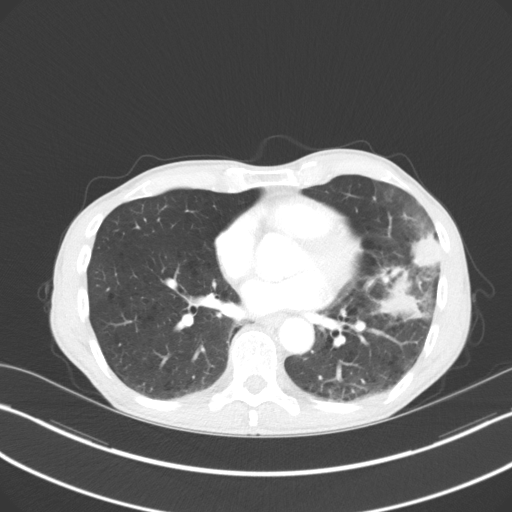
[im 41/74  lung]
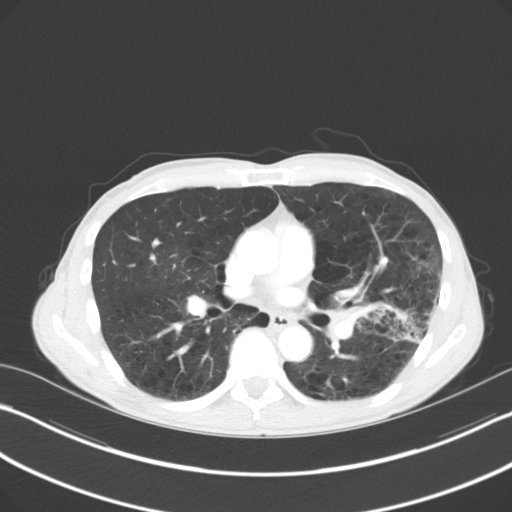
[im 46/74  lung]
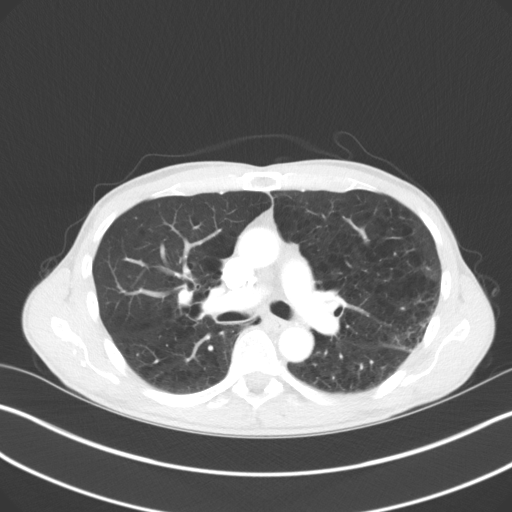
[im 52/74  mediastinal]
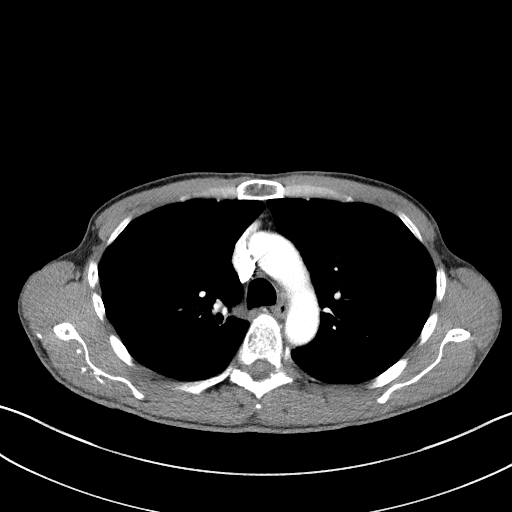
[im 52/74  lung]
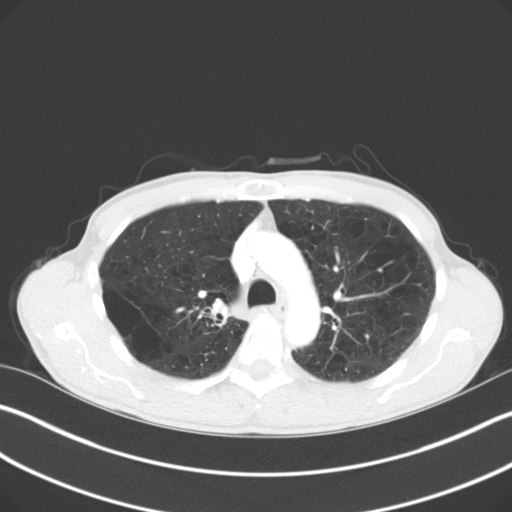
[im 57/74  lung]
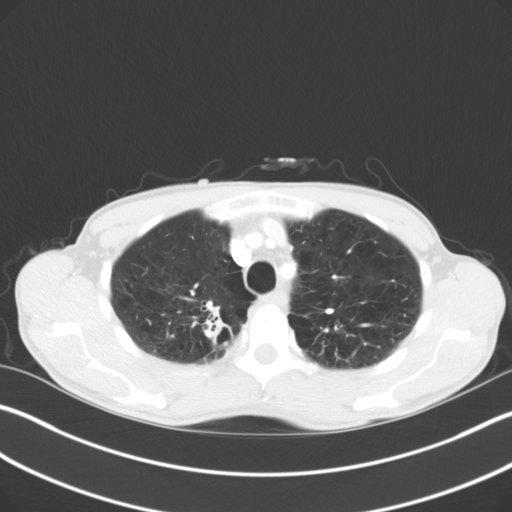
[im 63/74  lung]
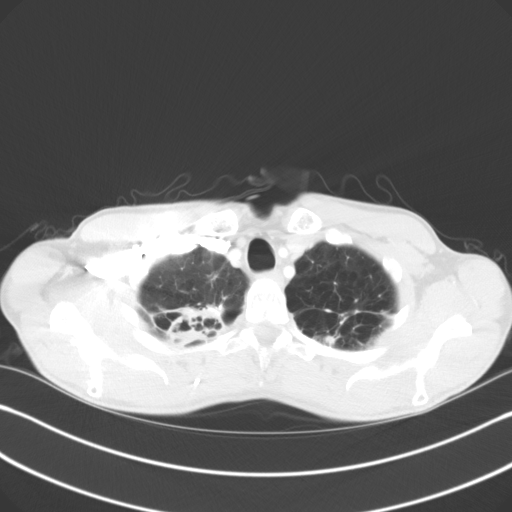
[im 68/74  lung]
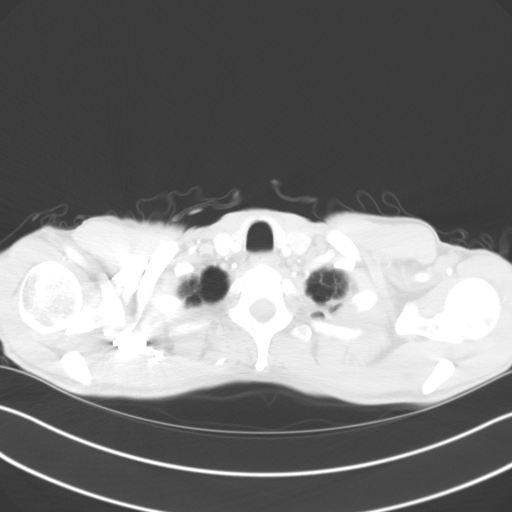

[Series 5: coronal · coronal · 0.64mm/px · 3 of 111 slices shown]
[im 23/111  lung]
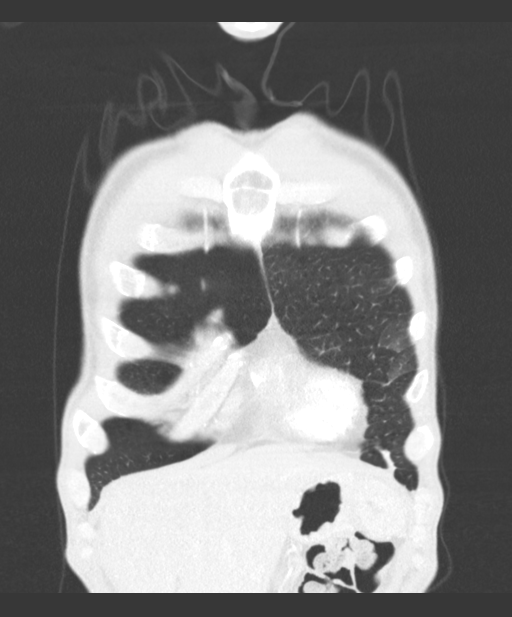
[im 45/111  lung]
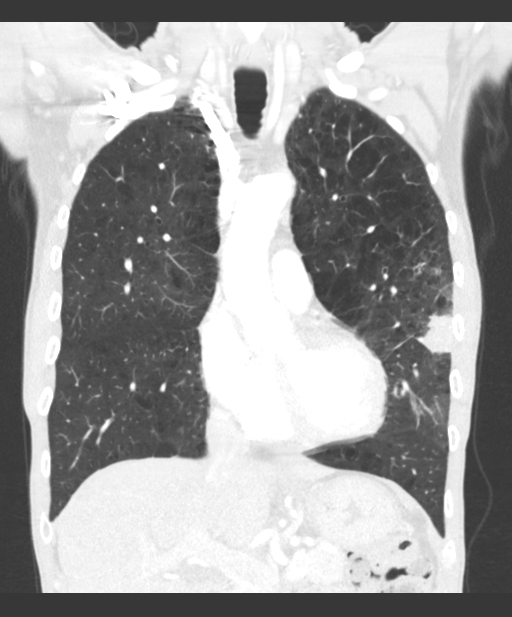
[im 67/111  lung]
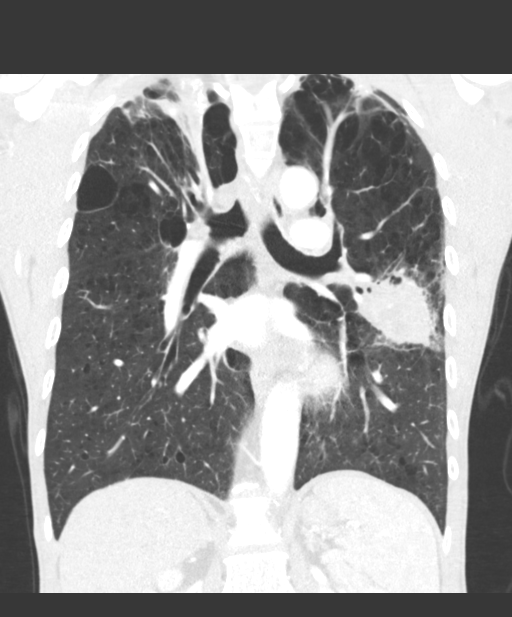

[15 of 36 positions shown; findings below may reference images not displayed]

FINDINGS: Cardiovascular: Normal heart size. No pericardial effusion. Normal
caliber thoracic aorta. Atherosclerosis of the upper abdominal
aorta. No central pulmonary embolism.

Mediastinum/Nodes: No enlarged mediastinal, hilar, or axillary lymph
nodes. Thyroid gland, trachea, and esophagus demonstrate no
significant findings.

Lungs/Pleura: Hyperinflation with moderate centrilobular and
paraseptal emphysema. Irregular patchy geographic consolidation in
the left upper lobe with surrounding patchy ground-glass density.
There are tiny foci of air within the consolidation, which may
represent minimal cavitation versus emphysematous locules. Traction
bronchiectasis and postinflammatory cavitation/scarring in the right
upper lobe. Additional left apical pleuroparenchymal scarring. 3 mm
nodule in the right lower lobe (series 3, image 139)

Upper Abdomen: No acute abnormality.

Musculoskeletal: No chest wall abnormality. No acute or significant
osseous findings.
IMPRESSION: 1. Irregular patchy geographic consolidation in the left upper lobe
with surrounding patchy ground-glass density. This is felt to more
likely represent pneumonia given the patient's clinical symptoms,
however a primary bronchogenic neoplasm is not entirely excluded in
this high risk patient. Follow-up CT in 1-2 months after treatment
is recommended to evaluate for resolution.
2.  Emphysema ([id number]).

## 2020-01-05 ENCOUNTER — Other Ambulatory Visit: Payer: Self-pay | Admitting: Osteopathic Medicine

## 2020-01-05 DIAGNOSIS — G4700 Insomnia, unspecified: Secondary | ICD-10-CM

## 2020-01-06 NOTE — Telephone Encounter (Signed)
Last refill 10/07/2019 Last Ov- 07/17/2019

## 2020-01-20 ENCOUNTER — Encounter: Payer: Self-pay | Admitting: Osteopathic Medicine

## 2020-01-20 DIAGNOSIS — Z0000 Encounter for general adult medical examination without abnormal findings: Secondary | ICD-10-CM

## 2020-01-27 ENCOUNTER — Encounter: Payer: Self-pay | Admitting: Osteopathic Medicine

## 2020-01-27 LAB — PSA, TOTAL WITH REFLEX TO PSA, FREE: PSA, Total: 1.9 ng/mL (ref <=4.0)

## 2020-01-27 LAB — COMPLETE METABOLIC PANEL WITH GFR
AG Ratio: 1.8 (calc) (ref 1.0–2.5)
ALT: 7 U/L — ABNORMAL LOW (ref 9–46)
AST: 12 U/L (ref 10–35)
Albumin: 4.2 g/dL (ref 3.6–5.1)
Alkaline phosphatase (APISO): 50 U/L (ref 35–144)
BUN: 18 mg/dL (ref 7–25)
CO2: 27 mmol/L (ref 20–32)
Calcium: 9.8 mg/dL (ref 8.6–10.3)
Chloride: 106 mmol/L (ref 98–110)
Creat: 1.2 mg/dL (ref 0.70–1.25)
GFR, Est African American: 75 mL/min/{1.73_m2} (ref >=60)
GFR, Est Non African American: 64 mL/min/{1.73_m2} (ref >=60)
Globulin: 2.3 g/dL (calc) (ref 1.9–3.7)
Glucose, Bld: 96 mg/dL (ref 65–99)
Potassium: 4.4 mmol/L (ref 3.5–5.3)
Sodium: 141 mmol/L (ref 135–146)
Total Bilirubin: 0.5 mg/dL (ref 0.2–1.2)
Total Protein: 6.5 g/dL (ref 6.1–8.1)

## 2020-01-27 LAB — CBC
HCT: 49.3 % (ref 38.5–50.0)
Hemoglobin: 16.2 g/dL (ref 13.2–17.1)
MCH: 31 pg (ref 27.0–33.0)
MCHC: 32.9 g/dL (ref 32.0–36.0)
MCV: 94.3 fL (ref 80.0–100.0)
MPV: 9.9 fL (ref 7.5–12.5)
Platelets: 205 10*3/uL (ref 140–400)
RBC: 5.23 10*6/uL (ref 4.20–5.80)
RDW: 13.1 % (ref 11.0–15.0)
WBC: 6.7 10*3/uL (ref 3.8–10.8)

## 2020-01-27 LAB — LIPID PANEL
Cholesterol: 210 mg/dL — ABNORMAL HIGH (ref <200)
HDL: 40 mg/dL (ref >=40)
LDL Cholesterol (Calc): 130 mg/dL (calc) — ABNORMAL HIGH
Non-HDL Cholesterol (Calc): 170 mg/dL (calc) — ABNORMAL HIGH (ref <130)
Total CHOL/HDL Ratio: 5.3 (calc) — ABNORMAL HIGH (ref <5.0)
Triglycerides: 258 mg/dL — ABNORMAL HIGH (ref <150)

## 2020-02-12 ENCOUNTER — Ambulatory Visit (INDEPENDENT_AMBULATORY_CARE_PROVIDER_SITE_OTHER): Payer: BC Managed Care – PPO | Admitting: Osteopathic Medicine

## 2020-02-12 ENCOUNTER — Encounter: Payer: Self-pay | Admitting: Osteopathic Medicine

## 2020-02-12 ENCOUNTER — Other Ambulatory Visit: Payer: Self-pay

## 2020-02-12 VITALS — BP 117/71 | HR 62 | Temp 98.0°F | Ht 70.0 in | Wt 127.0 lb

## 2020-02-12 DIAGNOSIS — Z0000 Encounter for general adult medical examination without abnormal findings: Secondary | ICD-10-CM | POA: Diagnosis not present

## 2020-02-12 DIAGNOSIS — Z23 Encounter for immunization: Secondary | ICD-10-CM

## 2020-02-12 DIAGNOSIS — J439 Emphysema, unspecified: Secondary | ICD-10-CM

## 2020-02-12 DIAGNOSIS — F17200 Nicotine dependence, unspecified, uncomplicated: Secondary | ICD-10-CM | POA: Diagnosis not present

## 2020-02-12 NOTE — Progress Notes (Signed)
Dylan Potter is a 64 y.o. male who presents to  [facility] at [facility]  today, 02/12/20, seeking care for the following:  . Annual physical     ASSESSMENT & PLAN with other pertinent findings:  The primary encounter diagnosis was Annual physical exam. Diagnoses of Flu vaccine need, Tobacco dependence, and Pulmonary emphysema, unspecified emphysema type (HCC) were also pertinent to this visit.   No results found for this or any previous visit (from the past 24 hour(s)).     Patient Instructions  General Preventive Care  Most recent routine screening labs: already done!   Blood pressure goal 130/80 or less.   Tobacco: don't! Please let me know if you need help quitting!  Alcohol: responsible moderation is ok for most adults - if you have concerns about your alcohol intake, please talk to me!   Exercise: as tolerated to reduce risk of cardiovascular disease and diabetes. Strength training will also prevent osteoporosis.   Mental health: if need for mental health care (medicines, counseling, other), or concerns about moods, please let me know!   Sexual / Reproductive health: if need for STD testing, or if concerns with libido/pain problems, please let me know!   Advanced Directive: Living Will and/or Healthcare Power of Attorney recommended for all adults, regardless of age or health.  Vaccines  Flu vaccine: for almost everyone, every fall.   Shingles vaccine: after age 57.   Pneumonia vaccines: booster after age 81.  Tetanus booster: every 10 years - due 2030  COVID vaccine: THANKS for getting your vaccine! :) Cancer screenings   Colon cancer screening: for everyone age 52-75. Colonoscopy available for all, many people also qualify for the Cologuard stool test   Prostate cancer screening: PSA blood test age 57-71  Lung cancer screening: CT chest every year for those aged 39 to 15 years who have a 20 pack-year smoking  history and currently smoke or have quit within the past 15 years  Infection screenings  . HIV: recommended screening at least once age 14-65 . Gonorrhea/Chlamydia: screening as needed . Hepatitis C: recommended once for everyone age 42-75 . TB: certain at-risk populations Other . Bone Density Test: recommended for men at age 40   Orders Placed This Encounter  Procedures  . CT CHEST LUNG CA SCREEN LOW DOSE W/O CM  . Flu Vaccine QUAD High Dose(Fluad)    No orders of the defined types were placed in this encounter.      Follow-up instructions: Return in about 1 year (around 02/11/2021) for [place] (call week prior to visit for lab orders).                                         BP 117/71 (BP Location: Right Arm, Patient Position: Sitting)   Pulse 62   Temp 98 F (36.7 C)   Ht 5\' 10"  (1.778 m)   Wt 127 lb (57.6 kg)   SpO2 98%   BMI 18.22 kg/m   Current Meds  Medication Sig  . ALPRAZolam (XANAX) 1 MG tablet TAKE 1-1.5 TABLETS (1-1.5 MG TOTAL) BY MOUTH AT BEDTIME AS NEEDED FOR ANXIETY OR SLEEP.  benzonatate (TESSALON) 200 MG capsule TAKE 1 CAPSULE BY MOUTH THREE TIMES A DAY AS NEEDED FOR COUGH  . fluticasone (FLONASE) 50 MCG/ACT nasal spray SPRAY 2 SPRAYS INTO EACH NOSTRIL EVERY DAY  .  traZODone (DESYREL) 100 MG tablet Take 1 tablet (100 mg total) by mouth at bedtime.    No results found for this or any previous visit (from the past 72 hour(s)).  No results found.     All questions at time of visit were answered - patient instructed to contact office with any additional concerns or updates.  ER/RTC precautions were reviewed with the patient as applicable.   Please note: voice recognition software was used to produce this document, and typos may escape review. Please contact Dr. Lyn Hollingshead for any needed clarifications.

## 2020-02-12 NOTE — Patient Instructions (Addendum)
General Preventive Care  Most recent routine screening labs: already done!   Blood pressure goal 130/80 or less.   Tobacco: don't! Please let me know if you need help quitting!  Alcohol: responsible moderation is ok for most adults - if you have concerns about your alcohol intake, please talk to me!   Exercise: as tolerated to reduce risk of cardiovascular disease and diabetes. Strength training will also prevent osteoporosis.   Mental health: if need for mental health care (medicines, counseling, other), or concerns about moods, please let me know!   Sexual / Reproductive health: if need for STD testing, or if concerns with libido/pain problems, please let me know!   Advanced Directive: Living Will and/or Healthcare Power of Attorney recommended for all adults, regardless of age or health.  Vaccines  Flu vaccine: for almost everyone, every fall.   Shingles vaccine: after age 56.   Pneumonia vaccines: booster after age 10.  Tetanus booster: every 10 years - due 2030  COVID vaccine: THANKS for getting your vaccine! :) Cancer screenings   Colon cancer screening: for everyone age 36-75. Colonoscopy available for all, many people also qualify for the Cologuard stool test   Prostate cancer screening: PSA blood test age 42-71  Lung cancer screening: CT chest every year for those aged 75 to 75 years who have a 20 pack-year smoking history and currently smoke or have quit within the past 15 years  Infection screenings  . HIV: recommended screening at least once age 65-65 . Gonorrhea/Chlamydia: screening as needed . Hepatitis C: recommended once for everyone age 70-75 . TB: certain at-risk populations Other . Bone Density Test: recommended for men at age 7

## 2020-02-25 ENCOUNTER — Encounter: Payer: Self-pay | Admitting: Osteopathic Medicine

## 2020-02-25 DIAGNOSIS — Z9189 Other specified personal risk factors, not elsewhere classified: Secondary | ICD-10-CM

## 2020-02-25 DIAGNOSIS — R911 Solitary pulmonary nodule: Secondary | ICD-10-CM

## 2020-03-02 NOTE — Telephone Encounter (Signed)
Results are available on Care Everywhere. Pls advise, thanks.

## 2020-03-03 ENCOUNTER — Encounter: Payer: Self-pay | Admitting: *Deleted

## 2020-03-03 DIAGNOSIS — R911 Solitary pulmonary nodule: Secondary | ICD-10-CM

## 2020-03-03 DIAGNOSIS — Z720 Tobacco use: Secondary | ICD-10-CM

## 2020-03-03 NOTE — Progress Notes (Signed)
Reached out to [name] to introduce myself as the office RN Navigator and explain our new patient process. Reviewed the reason for their referral and scheduled their new patient appointment along with labs. Provided address and directions to the office including call back phone number.   Oncology Nurse Navigator Documentation  Oncology Nurse Navigator Flowsheets 03/03/2020  Abnormal Finding Date 02/24/2020  Diagnosis Status Additional Work Up  Navigator Follow Up Date: 03/17/2020  Navigator Follow Up Reason: New Patient Appointment  Navigator Location [facility]  Referral Date to RadOnc/MedOnc 03/02/2020  Navigator Encounter Type Introductory Phone Call  Patient Visit Type MedOnc  Treatment Phase Abnormal Scans  Barriers/Navigation Needs Coordination of Care;Education  Education Other  Interventions Coordination of Care;Education;Referrals  Acuity Level 2-Minimal Needs (1-2 Barriers Identified)  Referrals Pulmonary  Coordination of Care Appts  Education Method Verbal  Support Groups/Services Friends and Family  Time Spent with Patient 45

## 2020-03-09 ENCOUNTER — Telehealth: Payer: Self-pay | Admitting: Hematology & Oncology

## 2020-03-09 NOTE — Telephone Encounter (Signed)
Called and LMVM for patient regarding [facility] Appointment date & time.  I also gave him Dr Richrd Humbles phone number should he need to reschedule this appt.,

## 2020-03-11 ENCOUNTER — Encounter: Payer: Self-pay | Admitting: Pulmonary Disease

## 2020-03-11 ENCOUNTER — Ambulatory Visit: Payer: BC Managed Care – PPO | Admitting: Pulmonary Disease

## 2020-03-11 ENCOUNTER — Other Ambulatory Visit: Payer: Self-pay

## 2020-03-11 VITALS — BP 120/68 | HR 65 | Temp 97.8°F | Ht 70.0 in | Wt 132.2 lb

## 2020-03-11 DIAGNOSIS — R9389 Abnormal findings on diagnostic imaging of other specified body structures: Secondary | ICD-10-CM

## 2020-03-11 DIAGNOSIS — R918 Other nonspecific abnormal finding of lung field: Secondary | ICD-10-CM

## 2020-03-11 DIAGNOSIS — F17210 Nicotine dependence, cigarettes, uncomplicated: Secondary | ICD-10-CM

## 2020-03-11 DIAGNOSIS — J432 Centrilobular emphysema: Secondary | ICD-10-CM | POA: Diagnosis not present

## 2020-03-11 DIAGNOSIS — F17200 Nicotine dependence, unspecified, uncomplicated: Secondary | ICD-10-CM

## 2020-03-11 DIAGNOSIS — J479 Bronchiectasis, uncomplicated: Secondary | ICD-10-CM | POA: Diagnosis not present

## 2020-03-11 NOTE — Patient Instructions (Addendum)
Thank you for visiting Dr. Tonia Brooms at [facility]. Today we recommend the following:  Orders Placed This Encounter  Procedures  . NM PET Image Initial (PI) Skull Base To Thigh   We will request a copy of images from [facility] for recent LDCT   Return in about 3 weeks (around 04/01/2020) for with APP or Dr. Tonia Brooms. to discuss next steps.  Please schedule with Kandice Robinsons, NP if I am unavailable.     Please do your part to reduce the spread of COVID-19.    You must quit smoking or vaping. This is the single most important thing that you can do to improve your lung health.   S = Set a quit date. T = Tell family, friends, and the people around you that you plan to quit. A = Anticipate or plan ahead for the tough times you'll face while quitting. R = Remove cigarettes and other tobacco products from your home, car, and work T = Talk to [place] about getting help to quit  If you need help feel free to reach out to our office, [facility] Smoking Cessation Class: [phone], call 1-800-QUIT-NOW, or visit [email or web address].

## 2020-03-11 NOTE — Progress Notes (Signed)
Synopsis: Referred in 03/11/2061 for Lung nodule by Sunnie Nielsen, DO  Subjective:   PATIENT ID: Dylan Potter GENDER: male DOB: 28-Feb-1959, MRN: [id number]  Chief Complaint  Patient presents with  . Consult    [facility] for several years.  worse recently.  going up the stairs makes the [facility] worse    PMH of anxiety, current smoker, 45 years, 1-1.5ppd, now cut down to 0.5ppd.  Overall patient doing well today.  He had a well check with his primary care provider and decision was made for sending for lung cancer screening CT.  He had this completed last week at [facility].  I cannot see the images today but it does state that he has read as a lung RADS category 4B suspicious that could represent a pulmonary malignancy within the right lung apex.  This lesion was read out to be 20 x 11 x 25 nodular opacification with associated right lung traction bronchiectasis.  Additionally there was a left upper lobe nodule that was 8 mm in its largest cross-section and a satellite nodule within the left upper lobe 5 mm largest cross-section.  He has other scattered smaller left lower lobe nodules documented.  Of note the patient did have a noncontrasted CT of the chest in 20 19 June that revealed a infiltrate that was resolving within the left upper lobe.  Had pleural-parenchymal scarring and traction bronchiectasis noted within the apex of the right.  There was still scarring in the left but less in comparison to both sides.  I did discuss with the patient today that I was unsure if the lesions described within the [facility] images compared it all to the lesions that were found in the 2062 imaging that I can review with and canopy.  As for the patient's shortness of breath he is able to get around and complete most of his activities of daily living.  He does use as needed albuterol but this is rare occasion.  He is working on smoking cessation.  He would like to finish his smoking venture  come Thanksgiving of this year.   Past Medical History:  Diagnosis Date  . History of anxiety      Family History  Problem Relation Age of Onset  . Hyperlipidemia Mother      Past Surgical History:  Procedure Laterality Date  . DEBRIDEMENT TENNIS ELBOW  2001, 2002  . HERNIA REPAIR  2000    Social History   Socioeconomic History  . Marital status: Married    Spouse name: Not on file  . Number of children: Not on file  . Years of education: Not on file  . Highest education level: Not on file  Occupational History  . Not on file  Tobacco Use  . Smoking status: Current Every Day Smoker    Packs/day: 0.50    Years: 40.00    Pack years: 20.00    Types: Cigarettes  . Smokeless tobacco: Never Used  Substance and Sexual Activity  . Alcohol use: No  . Drug use: No  . Sexual activity: Yes  Other Topics Concern  . Not on file  Social History Narrative  . Not on file   Social Determinants of Health   Financial Resource Strain:   . Difficulty of Paying Living Expenses: Not on file  Food Insecurity:   . Worried About Programme researcher, broadcasting/film/video in the Last Year: Not on file  . Ran Out of Food in the Last Year:  Not on file  Transportation Needs:   . Lack of Transportation (Medical): Not on file  . Lack of Transportation (Non-Medical): Not on file  Physical Activity:   . Days of Exercise per Week: Not on file  . Minutes of Exercise per Session: Not on file  Stress:   . Feeling of Stress : Not on file  Social Connections:   . Frequency of Communication with Friends and Family: Not on file  . Frequency of Social Gatherings with Friends and Family: Not on file  . Attends Religious Services: Not on file  . Active Member of Clubs or Organizations: Not on file  . Attends Banker Meetings: Not on file  . Marital Status: Not on file  Intimate Partner Violence:   . Fear of Current or Ex-Partner: Not on file  . Emotionally Abused: Not on file  . Physically Abused: Not  on file  . Sexually Abused: Not on file     Allergies  Allergen Reactions  . Levofloxacin In D5w Rash    Burning rash in genital area   . Temazepam Other (See Comments)    No help with sleep  . Zolpidem Other (See Comments)  . Ambien [Zolpidem Tartrate]     Sleepwalking  . Aspartame And Phenylalanine Nausea Only  . Codeine Rash and Itching     Outpatient Medications Prior to Visit  Medication Sig Dispense Refill  . ALPRAZolam (XANAX) 1 MG tablet TAKE 1-1.5 TABLETS (1-1.5 MG TOTAL) BY MOUTH AT BEDTIME AS NEEDED FOR ANXIETY OR SLEEP. 135 tablet 0  . benzonatate (TESSALON) 200 MG capsule TAKE 1 CAPSULE BY MOUTH THREE TIMES A DAY AS NEEDED FOR COUGH 30 capsule 2  . fluticasone (FLONASE) 50 MCG/ACT nasal spray SPRAY 2 SPRAYS INTO EACH NOSTRIL EVERY DAY 48 mL 2  . traZODone (DESYREL) 100 MG tablet Take 1 tablet (100 mg total) by mouth at bedtime. 90 tablet 3  . busPIRone (BUSPAR) 10 MG tablet TAKE 1 TABLET BY MOUTH 3 TIMES A DAY AS NEEDED FOR ANXIETY (Patient not taking: Reported on 02/12/2020) 270 tablet 3  . tamsulosin (FLOMAX) 0.4 MG CAPS capsule Take 1 capsule (0.4 mg total) by mouth daily. (Patient not taking: Reported on 02/12/2020) 90 capsule 1   No facility-administered medications prior to visit.    Review of Systems  Constitutional: Negative for chills, fever, malaise/fatigue and weight loss.  HENT: Negative for hearing loss, sore throat and tinnitus.   Eyes: Negative for blurred vision and double vision.  Respiratory: Positive for cough, sputum production and shortness of breath. Negative for hemoptysis, wheezing and stridor.   Cardiovascular: Negative for chest pain, palpitations, orthopnea, leg swelling and PND.  Gastrointestinal: Negative for abdominal pain, constipation, diarrhea, heartburn, nausea and vomiting.  Genitourinary: Negative for dysuria, hematuria and urgency.  Musculoskeletal: Negative for joint pain and myalgias.  Skin: Negative for itching and rash.    Neurological: Negative for dizziness, tingling, weakness and headaches.  Endo/Heme/Allergies: Negative for environmental allergies. Does not bruise/bleed easily.  Psychiatric/Behavioral: Negative for depression. The patient is not nervous/anxious and does not have insomnia.   All other systems reviewed and are negative.    Objective:  Physical Exam Vitals reviewed.  Constitutional:      General: He is not in acute distress.    Appearance: He is well-developed.  HENT:     Head: Normocephalic and atraumatic.     Mouth/Throat:     Pharynx: No oropharyngeal exudate.  Eyes:     Conjunctiva/sclera: Conjunctivae  normal.     Pupils: Pupils are equal, round, and reactive to light.  Neck:     Vascular: No JVD.     Trachea: No tracheal deviation.     Comments: Loss of supraclavicular fat Cardiovascular:     Rate and Rhythm: Normal rate and regular rhythm.     Heart sounds: S1 normal and S2 normal.     Comments: Distant heart tones Pulmonary:     Effort: No tachypnea or accessory muscle usage.     Breath sounds: No stridor. Decreased breath sounds (throughout all lung fields) present. No wheezing, rhonchi or rales.  Abdominal:     General: Bowel sounds are normal. There is no distension.     Palpations: Abdomen is soft.     Tenderness: There is no abdominal tenderness.  Musculoskeletal:        General: Deformity (muscle wasting ) present.  Skin:    General: Skin is warm and dry.     Capillary Refill: Capillary refill takes less than 2 seconds.     Findings: No rash.  Neurological:     Mental Status: He is alert and oriented to person, place, and time.  Psychiatric:        Behavior: Behavior normal.      Vitals:   03/11/20 1511  BP: 120/68  Pulse: 65  Temp: 97.8 F (36.6 C)  TempSrc: Tympanic  SpO2: 99%  Weight: 132 lb 4 oz (60 kg)  Height: 5\' 10"  (1.778 m)   99% on RA BMI Readings from Last 3 Encounters:  03/11/20 18.98 kg/m  02/12/20 18.22 kg/m  07/17/19 18.81  kg/m   Wt Readings from Last 3 Encounters:  03/11/20 132 lb 4 oz (60 kg)  02/12/20 127 lb (57.6 kg)  07/17/19 131 lb 1.9 oz (59.5 kg)     CBC    Component Value Date/Time   WBC 6.7 01/26/2020 0901   RBC 5.23 01/26/2020 0901   HGB 16.2 01/26/2020 0901   HCT 49.3 01/26/2020 0901   PLT 205 01/26/2020 0901   MCV 94.3 01/26/2020 0901   MCH 31.0 01/26/2020 0901   MCHC 32.9 01/26/2020 0901   RDW 13.1 01/26/2020 0901    Chest Imaging:  [facility] FINDINGS:   LUNG NODULES: Volume loss within the posterior right upper lobe associated with traction bronchiectasis at the right lung apex adjacent to a nodular opacification measuring 20 x 11 x 25 mm (series 5, image 66).   There is a 8 x 4 x 6 mm nodule in the left upper lobe (series 5, image 211) with adjacent 5 x 3 x 3 mm satellite nodule in the left upper lobe (image 212).   Several additional small pulmonary nodules in the left lower lobe with index lesions below:  -5 mm (5.6 x 5.0 mm) nodule in the left lower lobe) image 293).  -4 mm (4.7 x 2.6 mm) nodule in the left lower lobe (image 303).  -4 mm (4.7 x 3.3 mm) nodule in the left lower lobe base (image 313).     Pulmonary Functions Testing Results: No flowsheet data found.  FeNO:   Pathology:   Echocardiogram:   Heart Catheterization:     Assessment & Plan:     ICD-10-CM   1. Multiple lung nodules  R91.8 NM PET Image Initial (PI) Skull Base To Thigh  2. Abnormal screening CT of chest  R93.89 NM PET Image Initial (PI) Skull Base To Thigh  3. Centrilobular emphysema (HCC)  J43.2   4. Bronchiectasis without complication (HCC)  J47.9   5. Current smoker  F17.200     Discussion: This is a 64 year old longstanding history of smoking, recent lung cancer screening CT that was abnormal found to have multiple pulmonary nodules.  As described above he had a 2019 CT scan that we reviewed today in the office that is visible in canopy we compared this image  to the documented report from [facility].  I cannot review these images however the right upper lobe lesion of concern could represent some of the scarring that was seen on the image in 2019.  I am unsure of this.  Plan: I think the next best steps for evaluation of lung nodule to include nuclear medicine pet imaging. If there is any PET avid lesions with the chest we may consider navigational bronchoscopy or tissue diagnosis. I did reach out to his oncologist that referred for evaluation so he is aware of the next steps and planning. He does have some areas of bronchiectasis which I suspect are related to his longstanding history of COPD and areas of emphysema.  Also has the apical pleural-parenchymal scarring. At this time he can continue albuterol as needed for shortness of breath and wheezing. At some point I think he will need full pulmonary function test. We also discussed smoking cessation today at length.  Please see separate documentation.  Recommendations were communicated with Dr. Myna Hidalgo.  Smoking Cessation Counseling:   The patient's current tobacco use: currently at 0.5 ppd, total of 45+ years The patient was advised to quit and impact of smoking on their health.  I assessed the patient's willingness to attempt to quit. I provided methods and skills for cessation. We reviewed medication management of smoking session drugs if appropriate. He didn't tolerate chantix in the past. He tried wellbutrin but it caused N/V Resources to help quit smoking were provided. A smoking cessation quit date was set: Thanksgiving! 2021 Follow-up was arranged in our clinic.  The amount of time spent counseling patient was 5, separate of office time provided.    Current Outpatient Medications:  .  ALPRAZolam (XANAX) 1 MG tablet, TAKE 1-1.5 TABLETS (1-1.5 MG TOTAL) BY MOUTH AT BEDTIME AS NEEDED FOR ANXIETY OR SLEEP., Disp: 135 tablet, Rfl: 0 .  benzonatate (TESSALON) 200 MG capsule, TAKE 1  CAPSULE BY MOUTH THREE TIMES A DAY AS NEEDED FOR COUGH, Disp: 30 capsule, Rfl: 2 .  fluticasone (FLONASE) 50 MCG/ACT nasal spray, SPRAY 2 SPRAYS INTO EACH NOSTRIL EVERY DAY, Disp: 48 mL, Rfl: 2 .  traZODone (DESYREL) 100 MG tablet, Take 1 tablet (100 mg total) by mouth at bedtime., Disp: 90 tablet, Rfl: 3 .  busPIRone (BUSPAR) 10 MG tablet, TAKE 1 TABLET BY MOUTH 3 TIMES A DAY AS NEEDED FOR ANXIETY (Patient not taking: Reported on 02/12/2020), Disp: 270 tablet, Rfl: 3 .  tamsulosin (FLOMAX) 0.4 MG CAPS capsule, Take 1 capsule (0.4 mg total) by mouth daily. (Patient not taking: Reported on 02/12/2020), Disp: 90 capsule, Rfl: 1  I spent 46 minutes dedicated to the care of this patient on the date of this encounter to include pre-visit review of records, face-to-face time with the patient discussing conditions above, post visit ordering of testing, clinical documentation with the electronic health record, making appropriate referrals as documented, and communicating necessary findings to members of the patients care team.   [name], DO [facility] 03/11/2020 3:21 PM

## 2020-03-12 ENCOUNTER — Encounter: Payer: Self-pay | Admitting: *Deleted

## 2020-03-12 NOTE — Progress Notes (Signed)
Reviewed patient's appointment with pulmonary. They recommended a PET which they have scheduled for 03/24/20.  Oncology Nurse Navigator Documentation  Oncology Nurse Navigator Flowsheets 03/12/2020  Abnormal Finding Date -  Diagnosis Status -  Navigator Follow Up Date: 03/17/2020  Navigator Follow Up Reason: New Patient Appointment  Navigator Location [facility]  Referral Date to RadOnc/MedOnc -  Navigator Encounter Type Appt/Treatment Plan Review  Patient Visit Type MedOnc  Treatment Phase Abnormal Scans  Barriers/Navigation Needs -  Education -  Interventions -  Acuity -  Referrals -  Coordination of Care -  Education Method -  Support Groups/Services -  Time Spent with Patient 15

## 2020-03-16 ENCOUNTER — Encounter: Payer: Self-pay | Admitting: *Deleted

## 2020-03-16 NOTE — Progress Notes (Signed)
Patient was seen by pulmonary and they have ordered a PET scan for 03/24/2020. We will reschedule the appointment with Dr Myna Hidalgo for after the PET scan. Patient was notified of new appointment.   Oncology Nurse Navigator Documentation  Oncology Nurse Navigator Flowsheets 03/16/2020  Abnormal Finding Date -  Diagnosis Status -  Navigator Follow Up Date: 03/29/2020  Navigator Follow Up Reason: New Patient Appointment  Navigator Location [facility]  Referral Date to RadOnc/MedOnc -  Navigator Encounter Type Appt/Treatment Plan Review  Patient Visit Type MedOnc  Treatment Phase Abnormal Scans  Barriers/Navigation Needs Coordination of Care;Education  Education -  Interventions Coordination of Care  Acuity Level 2-Minimal Needs (1-2 Barriers Identified)  Referrals -  Coordination of Care Appts  Education Method -  Support Groups/Services Friends and Family  Time Spent with Patient 30

## 2020-03-17 ENCOUNTER — Inpatient Hospital Stay: Payer: BC Managed Care – PPO | Admitting: Hematology & Oncology

## 2020-03-17 ENCOUNTER — Inpatient Hospital Stay: Payer: BC Managed Care – PPO

## 2020-03-24 ENCOUNTER — Other Ambulatory Visit: Payer: Self-pay

## 2020-03-24 ENCOUNTER — Ambulatory Visit (HOSPITAL_COMMUNITY)
Admission: RE | Admit: 2020-03-24 | Discharge: 2020-03-24 | Disposition: A | Discharge status: Home / Self Care | Payer: BC Managed Care – PPO | Source: Ambulatory Visit | Attending: Pulmonary Disease | Admitting: Pulmonary Disease

## 2020-03-24 DIAGNOSIS — R9389 Abnormal findings on diagnostic imaging of other specified body structures: Secondary | ICD-10-CM

## 2020-03-24 DIAGNOSIS — R918 Other nonspecific abnormal finding of lung field: Secondary | ICD-10-CM | POA: Diagnosis not present

## 2020-03-24 LAB — GLUCOSE, CAPILLARY: Glucose-Capillary: 84 mg/dL (ref 70–99)

## 2020-03-24 MED ORDER — FLUDEOXYGLUCOSE F - 18 (FDG) INJECTION
6.8090 | Freq: Once | INTRAVENOUS | Status: AC
  Administered 2020-03-24: 6.809 via INTRAVENOUS

## 2020-03-25 ENCOUNTER — Encounter: Payer: Self-pay | Admitting: *Deleted

## 2020-03-25 NOTE — Progress Notes (Signed)
Reviewed PET scan with Dr Myna Hidalgo. Impression shows a low suspicion of malignancy. Dr Myna Hidalgo would like to hold on patient being seen in this office until malignancy is confirmed. He has a follow up appointment with Dr Myrlene Broker office on 04/05/20.  Called and spoke with patient. Reviewed PET scan results. Explained to him that until a malignancy is proven, he doesn't need to see a medical oncologist. Explained that after his follow up appointment, he should know whether or not pulmonary would like to proceed with a biopsy. We will definitely continue to follow patient, and if biopsy is positive, bring him in to be seen. Patient agreed with the plan. I will send him my direct phone number via MyChart as requested and he can reach out as needed. Appointments for 03/29/20 cancelled.  Oncology Nurse Navigator Documentation  Oncology Nurse Navigator Flowsheets 03/25/2020  Abnormal Finding Date -  Diagnosis Status -  Navigator Follow Up Date: 04/05/2020  Navigator Follow Up Reason: Appointment Review  Navigator Location [facility]  Referral Date to RadOnc/MedOnc -  Navigator Encounter Type Scan Review;Appt/Treatment Plan Review;Telephone  Telephone Outgoing Call;Appt Confirmation/Clarification  Patient Visit Type MedOnc  Treatment Phase Abnormal Scans  Barriers/Navigation Needs Coordination of Care;Education  Education Other  Interventions Coordination of Care;Education;Psycho-Social Support  Acuity Level 2-Minimal Needs (1-2 Barriers Identified)  Referrals -  Coordination of Care Other  Education Method Verbal  Support Groups/Services Friends and Family  Time Spent with Patient 45

## 2020-03-26 NOTE — Telephone Encounter (Signed)
Dr Tonia Brooms, please advise on pt email:  Dylan Potter ,Dylan Potter, Dylan there be any concerns about a biopsy being performed at this time. Dylan'll go with Potter on any judgement call if needed. Hoping if everything looks ok to Potter for now we could cancel office visit on the 29th.  Thank Potter,  Dylan Potter

## 2020-03-29 ENCOUNTER — Inpatient Hospital Stay: Payer: BC Managed Care – PPO | Admitting: Hematology & Oncology

## 2020-03-29 ENCOUNTER — Telehealth: Payer: Self-pay | Admitting: Pulmonary Disease

## 2020-03-29 ENCOUNTER — Inpatient Hospital Stay: Payer: BC Managed Care – PPO

## 2020-03-29 DIAGNOSIS — R918 Other nonspecific abnormal finding of lung field: Secondary | ICD-10-CM

## 2020-03-29 DIAGNOSIS — R9389 Abnormal findings on diagnostic imaging of other specified body structures: Secondary | ICD-10-CM

## 2020-03-29 NOTE — Telephone Encounter (Signed)
Recall placed

## 2020-03-29 NOTE — Telephone Encounter (Signed)
PCCM  I called and spoke with the patient regarding recent PET scan results.  Recommended a repeat noncontrasted CT of the chest in 4 months.  Please schedule office follow-up appointment with me after noncontrasted CT results.  [name], DO [facility] 03/29/2020 3:40 PM

## 2020-03-29 NOTE — Telephone Encounter (Signed)
[name], DO sent to Dylan Potter, CMA I called patient.  4 month noncontrasted CT chest ordered for march 2022   [name], DO  [facility]  03/29/2020 3:41 PM

## 2020-04-02 ENCOUNTER — Other Ambulatory Visit: Payer: Self-pay | Admitting: Osteopathic Medicine

## 2020-04-02 DIAGNOSIS — G4700 Insomnia, unspecified: Secondary | ICD-10-CM

## 2020-04-02 DIAGNOSIS — R053 Chronic cough: Secondary | ICD-10-CM

## 2020-04-05 ENCOUNTER — Ambulatory Visit: Payer: BC Managed Care – PPO | Admitting: Primary Care

## 2020-07-04 ENCOUNTER — Other Ambulatory Visit: Payer: Self-pay | Admitting: Osteopathic Medicine

## 2020-07-04 DIAGNOSIS — G4700 Insomnia, unspecified: Secondary | ICD-10-CM

## 2020-07-05 ENCOUNTER — Encounter: Payer: Self-pay | Admitting: Osteopathic Medicine

## 2020-07-20 ENCOUNTER — Other Ambulatory Visit: Payer: BC Managed Care – PPO

## 2020-08-05 ENCOUNTER — Encounter: Payer: Self-pay | Admitting: *Deleted

## 2020-08-05 NOTE — Progress Notes (Signed)
Patient had follow up CT scans scheduled for mid march which had to be cancelled due to family needs. He has not yet rescheduled them.   Reached out and spoke to the patient. He states he's not yet ready to reschedule, but hopes to have them done in mid-April. He states he has the number to call to reschedule and has no additional needs. Will continue to follow for CT scheduling and results.   Oncology Nurse Navigator Documentation  Oncology Nurse Navigator Flowsheets 08/05/2020  Abnormal Finding Date -  Diagnosis Status -  Navigator Follow Up Date: 08/16/2020  Navigator Follow Up Reason: Appointment Review  Navigator Location [facility]  Referral Date to RadOnc/MedOnc -  Navigator Encounter Type Telephone  Telephone Asess Navigation Needs;Appt Confirmation/Clarification;Outgoing Call  Patient Visit Type MedOnc  Treatment Phase Abnormal Scans  Barriers/Navigation Needs Coordination of Care;Education  Education Other  Interventions Psycho-Social Support;Education  Acuity Level 2-Minimal Needs (1-2 Barriers Identified)  Referrals -  Coordination of Care -  Education Method Verbal  Support Groups/Services Friends and Family  Time Spent with Patient 30

## 2020-08-27 ENCOUNTER — Encounter: Payer: Self-pay | Admitting: *Deleted

## 2020-08-27 ENCOUNTER — Telehealth: Payer: Self-pay

## 2020-08-27 NOTE — Progress Notes (Signed)
Patient has not rescheduled his CT scans which were supposed to be done in March. I contacted Dr Myrlene Broker office, as they were the ordering provider, to notify them that CTs have not yet been done, and patient has not scheduled follow up with that office. Benjie Karvonen will notify Dr Tonia Brooms and see how they would like to proceed.  Called patient. Asked if I could assist him with scheduling CTs. He states at this time he would like to continue to hold. His father is currently on hospice and this continues to be a stressor for him. Patient states he has the number to [employer] to call when he is ready to reschedule.   Oncology Nurse Navigator Documentation  Oncology Nurse Navigator Flowsheets 08/27/2020  Abnormal Finding Date -  Diagnosis Status -  Navigator Follow Up Date: -  Navigator Follow Up Reason: -  Navigator Location [facility]  Referral Date to RadOnc/MedOnc -  Navigator Encounter Type Appt/Treatment Plan Review;Telephone  Telephone Appt Confirmation/Clarification;Outgoing Call  Patient Visit Type MedOnc  Treatment Phase Abnormal Scans  Barriers/Navigation Needs Coordination of Care;Education  Education Other  Interventions Coordination of Care;Education;Psycho-Social Support  Acuity Level 2-Minimal Needs (1-2 Barriers Identified)  Referrals -  Coordination of Care Other  Education Method Verbal  Support Groups/Services Friends and Family  Time Spent with Patient 45

## 2020-08-27 NOTE — Telephone Encounter (Signed)
Oncology Navigator  08/27/2020 [facility], [place], [place]     Called patient. Asked if I could assist him with scheduling CTs. He states at this time he would like to continue to hold. His father is currently on hospice and this continues to be a stressor for him. Patient states he has the number to [employer] to call when he is ready to reschedule.

## 2020-08-27 NOTE — Telephone Encounter (Signed)
Dr. Tonia Brooms I received this message from [place], RN please advise on how you would like to proceed. Figured I would add you into the loop.  Camie Patience, I'm the navigator at the cancer center at [facility] and I have been following this patient. He was supposed to have a CT scan done in March, ordered by Dr Tonia Brooms for follow up on a suspicious lung nodule. He cancelled this CT and has no follow up currently with Dr Tonia Brooms. I have tried to get him to reschedule without success. Do you know if your office has attmepted to reach out at all? I don't want this patient to fall through the cracks, but I also can't make him do something he doesn't want to.   I advised I don't believe that we have reached out to him to get him scheduled. He has several cancellations and that I would send message to you. She said Thank you. I am going to call him again today to encourage him to schedule the CT and follow up

## 2020-08-30 ENCOUNTER — Other Ambulatory Visit: Payer: Self-pay

## 2020-08-30 ENCOUNTER — Ambulatory Visit (INDEPENDENT_AMBULATORY_CARE_PROVIDER_SITE_OTHER): Payer: Self-pay

## 2020-08-30 DIAGNOSIS — R918 Other nonspecific abnormal finding of lung field: Secondary | ICD-10-CM

## 2020-08-30 DIAGNOSIS — R9389 Abnormal findings on diagnostic imaging of other specified body structures: Secondary | ICD-10-CM

## 2020-08-30 NOTE — Telephone Encounter (Signed)
Thanks, I would right to make sure he is scheduled for follow up in our clinic whenever he is available to see [place] and make sure everything is on track.   Thanks  [name], DO [facility] 08/30/2020 4:50 PM

## 2020-09-06 NOTE — Telephone Encounter (Signed)
Dr. Tonia Brooms please advise on the following My Chart message:   Hello, I had the ct scan you requested for lungs on last Monday the 25th. Was wondering how things look now compared to last pet scan. Thank You for your time.  Thank you Dr. Tonia Brooms

## 2020-09-08 NOTE — Telephone Encounter (Signed)
Dr. Tonia Brooms, please advise on mychart message. Thanks!  Thank you for the results, would Dr. Tonia Brooms know anything about the cardio part to follow up on what said right coronary artery calcifications, if so thanks for the help.

## 2020-09-29 ENCOUNTER — Other Ambulatory Visit: Payer: Self-pay | Admitting: Osteopathic Medicine

## 2020-09-29 ENCOUNTER — Encounter: Payer: Self-pay | Admitting: Osteopathic Medicine

## 2020-09-29 DIAGNOSIS — G4700 Insomnia, unspecified: Secondary | ICD-10-CM

## 2020-09-30 MED ORDER — ALPRAZOLAM 1 MG PO TABS: ORAL_TABLET | ORAL | 0 refills | Status: DC

## 2020-10-11 ENCOUNTER — Encounter: Payer: Self-pay | Admitting: *Deleted

## 2020-10-11 NOTE — Progress Notes (Signed)
Most recent scans show low suspicion for cancer. Will discontinue navigation. Patient will follow with pulmonary.  Oncology Nurse Navigator Documentation  Oncology Nurse Navigator Flowsheets 10/11/2020  Abnormal Finding Date -  Diagnosis Status -  Navigator Follow Up Date: -  Navigator Follow Up Reason: -  Navigation Complete Date: 10/11/2020  Post Navigation: Continue to Follow Patient? No  Reason Not Navigating Patient: No Cancer Diagnosis  Navigator Location [facility]  Referral Date to RadOnc/MedOnc -  Navigator Encounter Type Scan Review  Telephone -  Patient Visit Type MedOnc  Treatment Phase Abnormal Scans  Barriers/Navigation Needs No Barriers At This Time  Education -  Interventions None Required  Acuity Level 1-No Barriers  Referrals -  Coordination of Care -  Education Method -  Support Groups/Services -  Time Spent with Patient 15

## 2020-12-27 ENCOUNTER — Other Ambulatory Visit: Payer: Self-pay | Admitting: Osteopathic Medicine

## 2020-12-27 DIAGNOSIS — G4700 Insomnia, unspecified: Secondary | ICD-10-CM

## 2021-01-07 ENCOUNTER — Emergency Department (INDEPENDENT_AMBULATORY_CARE_PROVIDER_SITE_OTHER): Payer: Self-pay

## 2021-01-07 ENCOUNTER — Emergency Department
Admission: EM | Admit: 2021-01-07 | Discharge: 2021-01-07 | Disposition: A | Discharge status: Home / Self Care | Payer: Self-pay | Source: Home / Self Care | Attending: Family Medicine | Admitting: Family Medicine

## 2021-01-07 DIAGNOSIS — R1030 Lower abdominal pain, unspecified: Secondary | ICD-10-CM

## 2021-01-07 DIAGNOSIS — R319 Hematuria, unspecified: Secondary | ICD-10-CM

## 2021-01-07 HISTORY — DX: Calculus of kidney: N20.0

## 2021-01-07 HISTORY — DX: Cardiomyopathy, unspecified: I42.9

## 2021-01-07 LAB — POCT URINALYSIS DIP (MANUAL ENTRY)
Bilirubin, UA: NEGATIVE
Glucose, UA: NEGATIVE mg/dL
Ketones, POC UA: NEGATIVE mg/dL
Leukocytes, UA: NEGATIVE
Nitrite, UA: NEGATIVE
Protein Ur, POC: NEGATIVE mg/dL
Spec Grav, UA: <=1.005 — AB (ref 1.010–1.025)
Urobilinogen, UA: 0.2 E.U./dL
pH, UA: 5.5 (ref 5.0–8.0)

## 2021-01-07 MED ORDER — CEPHALEXIN 500 MG PO CAPS: 500.0000 mg | ORAL_CAPSULE | Freq: Two times a day (BID) | ORAL | 0 refills | Status: AC

## 2021-01-07 NOTE — Discharge Instructions (Addendum)
Increase fluid intake.  May take Tylenol if needed for pain.  If symptoms become significantly worse during the night or over the weekend, proceed to the local emergency room.  

## 2021-01-07 NOTE — ED Provider Notes (Signed)
[name]    CSN: [id number] Arrival date & time: 01/07/21  1344      History   Chief Complaint Chief Complaint  Patient presents with   Abdominal Pain    HPI Dylan Potter is a 65 y.o. male.   Patient reports that he developed vague intermittent left lower back ache during the past two weeks.  During the past five days the pain migrated to his left lower abdomen intermittently, now constant.  He denies nausea/vomiting and diarrhea.  He noticed that his urine was darker than usual this morning, but he denies dysuria, frequency, urgency.  He feels well otherwise. He reports a past history of right kidney stone.  The history is provided by the patient.  Abdominal Pain Pain location:  LLQ Pain quality: aching   Pain radiation: left lower back. Pain severity:  Mild Onset quality:  Gradual Duration:  5 days Timing:  Constant Progression:  Worsening Chronicity:  New Context: not awakening from sleep, not diet changes, not eating, not recent illness and not trauma   Relieved by:  Nothing Worsened by:  Palpation Ineffective treatments:  None tried Associated symptoms: no anorexia, no chest pain, no chills, no constipation, no cough, no diarrhea, no dysuria, no fatigue, no fever, no hematemesis, no hematochezia, no hematuria, no nausea and no shortness of breath    Past Medical History:  Diagnosis Date   Cardiomyopathy (HCC)    History of anxiety    Kidney stone     Patient Active Problem List   Diagnosis Date Noted   Age-related nuclear cataract of right eye 04/22/2018   Grade II hemorrhoids 03/28/2018   Emphysema of lung (HCC) 02/28/2018   Depression 02/28/2018   Diverticulosis 02/28/2018   Right renal stone 02/28/2018   Pulmonary infiltrate 11/01/2017   Cigarette nicotine dependence without complication 11/01/2017   Tobacco abuse disorder 09/05/2016   Other fatigue 09/05/2016   Hematuria 12/19/2013   Insomnia 07/01/2013   Esophageal stricture  03/31/2013   Generalized anxiety disorder 12/27/2012   History of anxiety    Benign prostatic hyperplasia 11/24/2010    Past Surgical History:  Procedure Laterality Date   DEBRIDEMENT TENNIS ELBOW  2001, 2002   HERNIA REPAIR  2000       Home Medications    Prior to Admission medications   Medication Sig Start Date End Date Taking? Authorizing Provider  cephALEXin (KEFLEX) 500 MG capsule Take 1 capsule (500 mg total) by mouth 2 (two) times daily for 7 days. 01/07/21 01/14/21 Yes Lattie Haw, MD  ALPRAZolam Prudy Feeler) 1 MG tablet TAKE 1 TO 1 AND 1/2 TABLETS BY MOUTH AT BEDTIME AS NEEDED FOR SLEEP OR ANXIETY 12/27/20   Sunnie Nielsen, DO  benzonatate (TESSALON) 200 MG capsule TAKE 1 CAPSULE BY MOUTH 3 TIMES A DAY AS NEEDED FOR COUGH 04/06/20   Sunnie Nielsen, DO  busPIRone (BUSPAR) 10 MG tablet TAKE 1 TABLET BY MOUTH 3 TIMES A DAY AS NEEDED FOR ANXIETY Patient not taking: No sig reported 07/17/19   Sunnie Nielsen, DO  fluticasone [facility]) 50 MCG/ACT nasal spray SPRAY 2 SPRAYS INTO EACH NOSTRIL EVERY DAY 07/28/19   Sunnie Nielsen, DO  tamsulosin (FLOMAX) 0.4 MG CAPS capsule Take 1 capsule (0.4 mg total) by mouth daily. Patient not taking: Reported on 02/12/2020 07/17/19   Sunnie Nielsen, DO  traZODone (DESYREL) 100 MG tablet TAKE 1 TABLET BY MOUTH EVERYDAY AT BEDTIME 07/05/20   Sunnie Nielsen, DO    Family History Family History  Problem  Relation Age of Onset   Stroke Mother    Diabetes Mother    Hyperlipidemia Mother    Heart failure Father    Kidney failure Father     Social History Social History   Tobacco Use   Smoking status: Some Days    Packs/day: 0.50    Years: 40.00    Pack years: 20.00    Types: Cigarettes   Smokeless tobacco: Never  Vaping Use   Vaping Use: Never used  Substance Use Topics   Alcohol use: No   Drug use: No     Allergies   Levofloxacin in d5w, Temazepam, Zolpidem, Ambien [zolpidem tartrate], Aspartame and phenylalanine,  and Codeine   Review of Systems Review of Systems  Constitutional:  Negative for activity change, appetite change, chills, diaphoresis, fatigue and fever.  Respiratory:  Negative for cough and shortness of breath.   Cardiovascular:  Negative for chest pain.  Gastrointestinal:  Positive for abdominal pain. Negative for abdominal distention, anorexia, blood in stool, constipation, diarrhea, hematemesis, hematochezia and nausea.  Genitourinary:  Negative for dysuria, frequency, hematuria and urgency.  Musculoskeletal:  Positive for back pain.  Skin:  Negative for rash.  Neurological:  Negative for headaches.  Hematological:  Negative for adenopathy.  All other systems reviewed and are negative.   Physical Exam Triage Vital Signs ED Triage Vitals  Enc Vitals Group     BP 01/07/21 1537 (!) 146/93     Pulse Rate 01/07/21 1537 (!) 58     Resp 01/07/21 1537 20     Temp 01/07/21 1537 98 F (36.7 C)     Temp Source 01/07/21 1537 Oral     SpO2 01/07/21 1537 98 %     Weight 01/07/21 1532 120 lb (54.4 kg)     Height 01/07/21 1532 5\' 10"  (1.778 m)     Head Circumference --      Peak Flow --      Pain Score 01/07/21 1532 5     Pain Loc --      Pain Edu? --      Excl. in GC? --    No data found.  Updated Vital Signs BP (!) 136/95   Pulse (!) 58   Temp 98 F (36.7 C) (Oral)   Resp 20   Ht 5\' 10"  (1.778 m)   Wt 54.4 kg   SpO2 98%   BMI 17.22 kg/m   Visual Acuity Right Eye Distance:   Left Eye Distance:   Bilateral Distance:    Right Eye Near:   Left Eye Near:    Bilateral Near:     Physical Exam Nursing note reviewed.  Constitutional:      General: He is not in acute distress.    Appearance: He is not ill-appearing.  HENT:     Head: Normocephalic.     Mouth/Throat:     Mouth: Mucous membranes are moist.     Pharynx: Oropharynx is clear.  Eyes:     Pupils: Pupils are equal, round, and reactive to light.  Cardiovascular:     Rate and Rhythm: Regular rhythm.  Bradycardia present.     Heart sounds: Normal heart sounds.  Pulmonary:     Breath sounds: Normal breath sounds.  Abdominal:     General: Abdomen is flat. Bowel sounds are normal.     Palpations: Abdomen is soft.     Tenderness: There is abdominal tenderness in the suprapubic area and left lower quadrant. There is no right CVA  tenderness or left CVA tenderness. Negative signs include McBurney's sign, psoas sign and obturator sign.     Hernia: There is no hernia in the umbilical area or ventral area.    Musculoskeletal:     Cervical back: Neck supple.     Right lower leg: No edema.     Left lower leg: No edema.  Lymphadenopathy:     Cervical: No cervical adenopathy.  Skin:    General: Skin is warm and dry.     Findings: No rash.  Neurological:     Mental Status: He is alert and oriented to person, place, and time.     UC Treatments / Results  Labs (all labs ordered are listed, but only abnormal results are displayed) Labs Reviewed  POCT URINALYSIS DIP (MANUAL ENTRY) - Abnormal; Notable for the following components:      Result Value   Spec Grav, UA <=1.005 (*)    Blood, UA large (*)    All other components within normal limits  URINE CULTURE    EKG   Radiology CT Renal Stone Study  Result Date: 01/07/2021 CLINICAL DATA:  Intermittent lower abdominal pain x5 days EXAM: CT ABDOMEN AND PELVIS WITHOUT CONTRAST TECHNIQUE: Multidetector CT imaging of the abdomen and pelvis was performed following the standard protocol without IV contrast. COMPARISON:  PET-CT March 24, 2020 FINDINGS: Lower chest: Emphysematous change.  No acute abnormality. Hepatobiliary: Unremarkable noncontrast appearance of the hepatic parenchyma. Gallbladder is unremarkable. No biliary ductal dilation. Pancreas: Unremarkable noncontrast appearance of the pancreatic parenchyma. No pancreatic ductal dilation. Spleen: Within normal limits. Adrenals/Urinary Tract: Bilateral adrenal glands are unremarkable. No  hydronephrosis. Bilateral subcentimeter hypodense renal lesions incompletely evaluated on noncontrast examination. No renal, obstructive ureteral or bladder calculus visualized. Mild symmetric wall thickening of an incompletely distended urinary bladder. Stomach/Bowel: Small hiatal hernia otherwise the stomach is unremarkable for degree of distension. No pathologic dilation of small or large bowel. Normal appendix. Colonic diverticulosis without findings of acute diverticulitis. Vascular/Lymphatic: Aortic atherosclerosis. No pathologically enlarged abdominal or pelvic lymph nodes. Reproductive: Mild prostatic enlargement with median lobe hypertrophy. Other: No significant abdominopelvic ascites. No pneumoperitoneum. No walled off fluid collections. Musculoskeletal: Multilevel degenerative changes spine. Chronic L5 pars defects with grade 1 L5 on S1 anterolisthesis. No acute osseous abnormality. IMPRESSION: 1. Mild symmetric wall thickening of an incompletely distended urinary bladder. Correlate with urinalysis to exclude cystitis. 2. Colonic diverticulosis without findings of acute diverticulitis. 3. No renal, ureteral or bladder calculi visualized. No hydronephrosis. 4. Aortic Atherosclerosis (ICD10-I70.0) and Emphysema (ICD10-J43.9). Electronically Signed   By: Maudry Mayhew M.D.   On: 01/07/2021 16:34    Procedures Procedures (including critical care time)  Medications Ordered in UC Medications - No data to display  Initial Impression / Assessment and Plan / UC Course  I have reviewed the triage vital signs and the nursing notes.  Pertinent labs & imaging results that were available during my care of the patient were reviewed by me and considered in my medical decision making (see chart for details).    CT renal stone study reveals no calculi.  Note finding of mild symmetric wall thickening of an incompletely distended urinary bladder. ?cystitis.  Check urine culture.  Begin empiric  Keflex. Followup with Family Doctor if not improved in one week.   Final Clinical Impressions(s) / UC Diagnoses   Final diagnoses:  Hematuria, unspecified type  Lower abdominal pain     Discharge Instructions      Increase fluid intake.  May take  Tylenol if needed for pain.  If symptoms become significantly worse during the night or over the weekend, proceed to the local emergency room.      ED Prescriptions     Medication Sig Dispense Auth. Provider   cephALEXin (KEFLEX) 500 MG capsule Take 1 capsule (500 mg total) by mouth 2 (two) times daily for 7 days. 14 capsule Lattie HawBeese, Jerrico Covello A, MD         Lattie HawBeese, Madisson Kulaga A, MD 01/08/21 [phone]

## 2021-01-07 NOTE — ED Triage Notes (Signed)
Pt presents to Urgent Care with c/o intermittent lower abdominal pain x approx 5 days. Denies nausea and diarrhea. Reports darker than normal urine this AM, but denies urinary frequency or dysuria.

## 2021-01-09 LAB — URINE CULTURE
MICRO NUMBER:: 12330613
Result:: NO GROWTH
SPECIMEN QUALITY:: ADEQUATE

## 2021-01-15 ENCOUNTER — Encounter: Payer: Self-pay | Admitting: Osteopathic Medicine

## 2021-03-25 ENCOUNTER — Other Ambulatory Visit: Payer: Self-pay | Admitting: Osteopathic Medicine

## 2021-03-25 DIAGNOSIS — G4700 Insomnia, unspecified: Secondary | ICD-10-CM

## 2021-03-28 ENCOUNTER — Encounter: Payer: Self-pay | Admitting: *Deleted

## 2021-03-29 ENCOUNTER — Telehealth: Payer: Self-pay | Admitting: Family Medicine

## 2021-03-29 NOTE — Telephone Encounter (Signed)
A refill has been sent to Dr Ashley Royalty under refill encounter.

## 2021-03-29 NOTE — Telephone Encounter (Signed)
Forbes states today is runs out of the xanax. He has scheduled an appointment on 04/28/2021.

## 2021-03-29 NOTE — Telephone Encounter (Signed)
Pt called this morning and left a voicemail stating he was in need of refills. Phone number on file is correct.

## 2021-05-03 ENCOUNTER — Ambulatory Visit (INDEPENDENT_AMBULATORY_CARE_PROVIDER_SITE_OTHER): Payer: Self-pay | Admitting: Family Medicine

## 2021-05-03 ENCOUNTER — Other Ambulatory Visit: Payer: Self-pay

## 2021-05-03 ENCOUNTER — Encounter: Payer: Self-pay | Admitting: Family Medicine

## 2021-05-03 VITALS — BP 137/80 | HR 64 | Ht 70.0 in | Wt 123.0 lb

## 2021-05-03 DIAGNOSIS — Z23 Encounter for immunization: Secondary | ICD-10-CM

## 2021-05-03 DIAGNOSIS — J439 Emphysema, unspecified: Secondary | ICD-10-CM

## 2021-05-03 DIAGNOSIS — G4700 Insomnia, unspecified: Secondary | ICD-10-CM

## 2021-05-03 DIAGNOSIS — Z8659 Personal history of other mental and behavioral disorders: Secondary | ICD-10-CM

## 2021-05-03 MED ORDER — TRAZODONE HCL 100 MG PO TABS: 200.0000 mg | ORAL_TABLET | Freq: Every evening | ORAL | 3 refills | Status: DC | PRN

## 2021-05-08 ENCOUNTER — Encounter: Payer: Self-pay | Admitting: Family Medicine

## 2021-05-08 NOTE — Progress Notes (Signed)
Dylan Potter - 66 y.o. male MRN [id number]  Date of birth: 1955/05/10  Subjective No chief complaint on file.   HPI Dylan Potter is a 66 year old male here today for follow-up.  He is transferring care from Dr. Lyn Hollingshead.  He has history of anxiety with panic and emphysema.  Anxiety is currently treated with alprazolam and trazodone at bedtime.  He reports trying several SSRIs in the past.  He states that these were not tolerated or caused significant side effects.  Reports that he does feel good with current medications.  ROS:  A comprehensive ROS was completed and negative except as noted per HPI  Allergies  Allergen Reactions   Levofloxacin In D5w Rash    Burning rash in genital area    Temazepam Other (See Comments)    No help with sleep   Zolpidem Other (See Comments)   Ambien [Zolpidem Tartrate]     Sleepwalking   Aspartame And Phenylalanine Nausea Only   Codeine Rash and Itching    Past Medical History:  Diagnosis Date   Cardiomyopathy (HCC)    History of anxiety    Kidney stone     Past Surgical History:  Procedure Laterality Date   DEBRIDEMENT TENNIS ELBOW  2001, 2002   HERNIA REPAIR  2000    Social History   Socioeconomic History   Marital status: Married    Spouse name: Not on file   Number of children: Not on file   Years of education: Not on file   Highest education level: Not on file  Occupational History   Not on file  Tobacco Use   Smoking status: Some Days    Packs/day: 0.50    Years: 40.00    Pack years: 20.00    Types: Cigarettes   Smokeless tobacco: Never  Vaping Use   Vaping Use: Never used  Substance and Sexual Activity   Alcohol use: No   Drug use: No   Sexual activity: Yes  Other Topics Concern   Not on file  Social History Narrative   Not on file   Social Determinants of Health   Financial Resource Strain: Not on file  Food Insecurity: Not on file  Transportation Needs: Not on file  Physical Activity: Not on file  Stress: Not on  file  Social Connections: Not on file    Family History  Problem Relation Age of Onset   Stroke Mother    Diabetes Mother    Hyperlipidemia Mother    Heart failure Father    Kidney failure Father     Health Maintenance  Topic Date Due   HIV Screening  Never done   Hepatitis C Screening  Never done   Zoster Vaccines- Shingrix (1 of 2) Never done   Pneumococcal Vaccine 33-18 Years old (2 - PCV) 05/08/2008   COVID-19 Vaccine (4 - Booster for Moderna series) 07/30/2020   COLONOSCOPY (Pts 45-31yrs Insurance coverage will need to be confirmed)  02/06/2023   TETANUS/TDAP  01/07/2029   INFLUENZA VACCINE  Completed   HPV VACCINES  Aged Out     ----------------------------------------------------------------------------------------------------------------------------------------------------------------------------------------------------------------- Physical Exam BP 137/80    Pulse 64    Ht 5\' 10"  (1.778 m)    Wt 123 lb (55.8 kg)    SpO2 99%    BMI 17.65 kg/m   Physical Exam Constitutional:      Appearance: Normal appearance.  HENT:     Head: Normocephalic and atraumatic.  Cardiovascular:     Rate and Rhythm: Normal rate  and regular rhythm.  Pulmonary:     Effort: Pulmonary effort is normal.     Breath sounds: Normal breath sounds.  Musculoskeletal:     Cervical back: Neck supple.  Neurological:     Mental Status: He is alert.  Psychiatric:        Mood and Affect: Mood normal.        Behavior: Behavior normal.    ------------------------------------------------------------------------------------------------------------------------------------------------------------------------------------------------------------------- Assessment and Plan  Emphysema of lung (HCC) Smoking cessation recommended.  No significant symptoms at this time.  History of anxiety Alprazolam renewed at this time however discussed weaning back on this.  Trazodone renewed for associated  insomnia.   Meds ordered this encounter  Medications   traZODone (DESYREL) 100 MG tablet    Sig: Take 2 tablets (200 mg total) by mouth at bedtime as needed for sleep.    Dispense:  180 tablet    Refill:  3    Return in about 6 weeks (around 06/14/2021) for F/u Insomnia-Ok for virtual/telephone.    This visit occurred during the SARS-CoV-2 public health emergency.  Safety protocols were in place, including screening questions prior to the visit, additional usage of staff PPE, and extensive cleaning of exam room while observing appropriate contact time as indicated for disinfecting solutions.

## 2021-05-08 NOTE — Assessment & Plan Note (Signed)
Smoking cessation recommended.  No significant symptoms at this time.

## 2021-05-08 NOTE — Assessment & Plan Note (Signed)
Alprazolam renewed at this time however discussed weaning back on this.  Trazodone renewed for associated insomnia.

## 2021-06-01 ENCOUNTER — Encounter: Payer: Self-pay | Admitting: Family Medicine

## 2021-06-02 ENCOUNTER — Other Ambulatory Visit: Payer: Self-pay | Admitting: Family Medicine

## 2021-06-02 DIAGNOSIS — G4700 Insomnia, unspecified: Secondary | ICD-10-CM

## 2021-06-02 MED ORDER — TRAZODONE HCL 100 MG PO TABS: 150.0000 mg | ORAL_TABLET | Freq: Every evening | ORAL | 3 refills | Status: DC | PRN

## 2021-06-02 NOTE — Telephone Encounter (Signed)
Rx updated.

## 2021-06-14 ENCOUNTER — Telehealth: Payer: Self-pay | Admitting: Family Medicine

## 2021-06-24 ENCOUNTER — Other Ambulatory Visit: Payer: Self-pay | Admitting: Family Medicine

## 2021-06-24 DIAGNOSIS — G4700 Insomnia, unspecified: Secondary | ICD-10-CM

## 2021-06-24 MED ORDER — ALPRAZOLAM 1 MG PO TABS: ORAL_TABLET | ORAL | 1 refills | Status: DC

## 2021-06-24 NOTE — Telephone Encounter (Signed)
Pls contact pt to schedule follow-up for insomnia. Sending 30 day med refill. Thanks

## 2021-06-27 ENCOUNTER — Encounter: Payer: Self-pay | Admitting: Family Medicine

## 2021-07-21 ENCOUNTER — Ambulatory Visit (INDEPENDENT_AMBULATORY_CARE_PROVIDER_SITE_OTHER): Payer: Self-pay | Admitting: Family Medicine

## 2021-07-21 ENCOUNTER — Other Ambulatory Visit: Payer: Self-pay

## 2021-07-21 ENCOUNTER — Encounter: Payer: Self-pay | Admitting: Family Medicine

## 2021-07-21 DIAGNOSIS — G4700 Insomnia, unspecified: Secondary | ICD-10-CM

## 2021-07-21 MED ORDER — ALPRAZOLAM 1 MG PO TABS: ORAL_TABLET | ORAL | 1 refills | Status: DC

## 2021-07-21 NOTE — Progress Notes (Signed)
?Dylan Potter - 66 y.o. male MRN [id number]  Date of birth: 09-May-1955 ? ?Subjective ?Chief Complaint  ?Patient presents with  ? Medication Refill  ? ? ?HPI ?Dylan Potter is a 66 year old male here today for follow-up of insomnia.  Current management with trazodone 150 mg nightly along with alprazolam 1.5 mg nightly.  Has worked the best for him.  He does not have any side effects.  Denies any morning or daytime fatigue. ? ?ROS:  A comprehensive ROS was completed and negative except as noted per HPI ? ?Allergies  ?Allergen Reactions  ? Levofloxacin In D5w Rash  ?  Burning rash in genital area ?  ? Temazepam Other (See Comments)  ?  No help with sleep  ? Zolpidem Other (See Comments)  ? Ambien [Zolpidem Tartrate]   ?  Sleepwalking  ? Aspartame And Phenylalanine Nausea Only  ? Codeine Rash and Itching  ? ? ?Past Medical History:  ?Diagnosis Date  ? Cardiomyopathy (HCC)   ? History of anxiety   ? Kidney stone   ? ? ?Past Surgical History:  ?Procedure Laterality Date  ? DEBRIDEMENT TENNIS ELBOW  2001, 2002  ? HERNIA REPAIR  2000  ? ? ?Social History  ? ?Socioeconomic History  ? Marital status: Married  ?  Spouse name: Not on file  ? Number of children: Not on file  ? Years of education: Not on file  ? Highest education level: Not on file  ?Occupational History  ? Not on file  ?Tobacco Use  ? Smoking status: Some Days  ?  Packs/day: 0.50  ?  Years: 40.00  ?  Pack years: 20.00  ?  Types: Cigarettes  ? Smokeless tobacco: Never  ?Vaping Use  ? Vaping Use: Never used  ?Substance and Sexual Activity  ? Alcohol use: No  ? Drug use: No  ? Sexual activity: Yes  ?Other Topics Concern  ? Not on file  ?Social History Narrative  ? Not on file  ? ?Social Determinants of Health  ? ?Financial Resource Strain: Not on file  ?Food Insecurity: Not on file  ?Transportation Needs: Not on file  ?Physical Activity: Not on file  ?Stress: Not on file  ?Social Connections: Not on file  ? ? ?Family History  ?Problem Relation Age of Onset  ? Stroke Mother    ? Diabetes Mother   ? Hyperlipidemia Mother   ? Heart failure Father   ? Kidney failure Father   ? ? ?Health Maintenance  ?Topic Date Due  ? HIV Screening  Never done  ? Hepatitis C Screening  Never done  ? COVID-19 Vaccine (4 - Booster for Moderna series) 02/05/2022 (Originally 07/30/2020)  ? Zoster Vaccines- Shingrix (1 of 2) 02/05/2022 (Originally 09/16/2007)  ? COLONOSCOPY (Pts 45-33yrs Insurance coverage will need to be confirmed)  02/06/2023  ? TETANUS/TDAP  01/07/2029  ? INFLUENZA VACCINE  Completed  ? HPV VACCINES  Aged Out  ? ? ? ?----------------------------------------------------------------------------------------------------------------------------------------------------------------------------------------------------------------- ?Physical Exam ?BP 118/78 (BP Location: Left Arm, Patient Position: Sitting, Cuff Size: Normal)   Pulse 65   Ht 5\' 10"  (1.778 m)   Wt 127 lb (57.6 kg)   SpO2 98%   BMI 18.22 kg/m?  ? ?Physical Exam ?Constitutional:   ?   Appearance: Normal appearance.  ?Eyes:  ?   General: No scleral icterus. ?Cardiovascular:  ?   Rate and Rhythm: Normal rate and regular rhythm.  ?   Pulses: Normal pulses.  ?   Heart sounds: Normal heart sounds.  ?Musculoskeletal:  ?  Cervical back: Neck supple.  ?Neurological:  ?   Mental Status: He is alert.  ? ? ?------------------------------------------------------------------------------------------------------------------------------------------------------------------------------------------------------------------- ?Assessment and Plan ? ?Insomnia ?He has tried multiple medications for management of insomnia in the past current regimen of trazodone with alprazolam is working well for him.  We have been able to cut back on his alprazolam with addition of trazodone.  Hopefully we can continue to wean back on this. ? ? ?Meds ordered this encounter  ?Medications  ? ALPRAZolam (XANAX) 1 MG tablet  ?  Sig: TAKE 1 TO 1 AND 1/2 TABLETS BY MOUTH AT  BEDTIME AS NEEDED FOR SLEEP OR ANXIETY  ?  Dispense:  135 tablet  ?  Refill:  1  ?  Not to exceed 4 additional fills before 09/25/2021  ? ? ?Return in about 6 months (around 01/21/2022) for Insomnia. ? ? ? ?This visit occurred during the SARS-CoV-2 public health emergency.  Safety protocols were in place, including screening questions prior to the visit, additional usage of staff PPE, and extensive cleaning of exam room while observing appropriate contact time as indicated for disinfecting solutions.  ? ?

## 2021-07-21 NOTE — Assessment & Plan Note (Signed)
He has tried multiple medications for management of insomnia in the past current regimen of trazodone with alprazolam is working well for him.  We have been able to cut back on his alprazolam with addition of trazodone.  Hopefully we can continue to wean back on this. ?

## 2021-12-10 ENCOUNTER — Encounter: Payer: Self-pay | Admitting: Family Medicine

## 2021-12-18 ENCOUNTER — Ambulatory Visit
Admission: EM | Admit: 2021-12-18 | Discharge: 2021-12-18 | Disposition: A | Discharge status: Home / Self Care | Payer: Self-pay | Attending: Family Medicine | Admitting: Family Medicine

## 2021-12-18 ENCOUNTER — Other Ambulatory Visit: Payer: Self-pay

## 2021-12-18 ENCOUNTER — Ambulatory Visit (INDEPENDENT_AMBULATORY_CARE_PROVIDER_SITE_OTHER): Payer: Self-pay

## 2021-12-18 DIAGNOSIS — G8929 Other chronic pain: Secondary | ICD-10-CM

## 2021-12-18 DIAGNOSIS — M25512 Pain in left shoulder: Secondary | ICD-10-CM

## 2021-12-18 DIAGNOSIS — M25511 Pain in right shoulder: Secondary | ICD-10-CM

## 2021-12-18 MED ORDER — PREDNISONE 10 MG (21) PO TBPK: ORAL_TABLET | Freq: Every day | ORAL | 0 refills | Status: DC

## 2021-12-18 MED ORDER — CELECOXIB 100 MG PO CAPS: 100.0000 mg | ORAL_CAPSULE | Freq: Two times a day (BID) | ORAL | 0 refills | Status: AC

## 2021-12-18 MED ORDER — OXYCODONE-ACETAMINOPHEN 5-325 MG PO TABS: 1.0000 | ORAL_TABLET | Freq: Three times a day (TID) | ORAL | 0 refills | Status: DC | PRN

## 2021-12-18 NOTE — ED Triage Notes (Signed)
Pt presents with rt shoulder pain that began "several" months ago bur has progressively worsened over the last week. 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

## 2021-12-18 NOTE — ED Provider Notes (Signed)
[facility]    CSN: [id number] Arrival date & time: 12/18/21  1259      History   Chief Complaint Chief Complaint  Patient presents with   Shoulder Pain    HPI Dylan Potter is a 66 y.o. male.   HPI Dylan Potter 66 year old male presents with right shoulder pain for several months.  PMH significant for cardiomyopathy, nicotine dependence, emphysema, and BPH.  Past Medical History:  Diagnosis Date   Cardiomyopathy [facility])    History of anxiety    Kidney stone     Patient Active Problem List   Diagnosis Date Noted   Age-related nuclear cataract of right eye 04/22/2018   Grade II hemorrhoids 03/28/2018   Emphysema of lung (HCC) 02/28/2018   Depression 02/28/2018   Diverticulosis 02/28/2018   Right renal stone 02/28/2018   Pulmonary infiltrate 11/01/2017   Cigarette nicotine dependence without complication 11/01/2017   Tobacco abuse disorder 09/05/2016   Other fatigue 09/05/2016   Hematuria 12/19/2013   Insomnia 07/01/2013   Esophageal stricture 03/31/2013   Generalized anxiety disorder 12/27/2012   History of anxiety    Benign prostatic hyperplasia 11/24/2010    Past Surgical History:  Procedure Laterality Date   DEBRIDEMENT TENNIS ELBOW  2001, 2002   HERNIA REPAIR  2000       Home Medications    Prior to Admission medications   Medication Sig Start Date End Date Taking? Authorizing Provider  celecoxib (CELEBREX) 100 MG capsule Take 1 capsule (100 mg total) by mouth 2 (two) times daily for 15 days. 12/18/21 01/02/22 Yes Trevor Iha, FNP  oxyCODONE-acetaminophen (PERCOCET/ROXICET) 5-325 MG tablet Take 1 tablet by mouth every 8 (eight) hours as needed for severe pain. 12/18/21  Yes Trevor Iha, FNP  predniSONE (STERAPRED UNI-PAK 21 TAB) 10 MG (21) TBPK tablet Take by mouth daily. Take 6 tabs by mouth daily  for 2 days, then 5 tabs for 2 days, then 4 tabs for 2 days, then 3 tabs for 2 days, 2 tabs for 2 days, then 1 tab by mouth daily for 2 days 12/18/21   Yes Trevor Iha, FNP  ALPRAZolam Prudy Feeler) 1 MG tablet TAKE 1 TO 1 AND 1/2 TABLETS BY MOUTH AT BEDTIME AS NEEDED FOR SLEEP OR ANXIETY 07/21/21   Everrett Coombe, DO  benzonatate (TESSALON) 200 MG capsule TAKE 1 CAPSULE BY MOUTH 3 TIMES A DAY AS NEEDED FOR COUGH Patient not taking: Reported on 07/21/2021 04/06/20   Sunnie Nielsen, DO  traZODone (DESYREL) 100 MG tablet Take 1.5 tablets (150 mg total) by mouth at bedtime as needed for sleep. 06/02/21   Everrett Coombe, DO    Family History Family History  Problem Relation Age of Onset   Stroke Mother    Diabetes Mother    Hyperlipidemia Mother    Heart failure Father    Kidney failure Father     Social History Social History   Tobacco Use   Smoking status: Some Days    Packs/day: 0.50    Years: 40.00    Total pack years: 20.00    Types: Cigarettes   Smokeless tobacco: Never  Vaping Use   Vaping Use: Never used  Substance Use Topics   Alcohol use: No   Drug use: No     Allergies   Levofloxacin in d5w, Temazepam, Zolpidem, Ambien [zolpidem tartrate], Aspartame and phenylalanine, and Codeine   Review of Systems Review of Systems  Musculoskeletal:        Right shoulder pain x3 months  Physical Exam Triage Vital Signs ED Triage Vitals  Enc Vitals Group     BP      Pulse      Resp      Temp      Temp src      SpO2      Weight      Height      Head Circumference      Peak Flow      Pain Score      Pain Loc      Pain Edu?      Excl. in GC?    No data found.  Updated Vital Signs BP 129/86 (BP Location: Left Arm)   Pulse 68   Temp 98.2 F (36.8 C) (Oral)   Resp 14   SpO2 96%      Physical Exam Vitals and nursing note reviewed.  Constitutional:      Appearance: Normal appearance. He is normal weight.  HENT:     Head: Normocephalic and atraumatic.     Mouth/Throat:     Mouth: Mucous membranes are moist.     Pharynx: Oropharynx is clear.  Eyes:     Extraocular Movements: Extraocular movements  intact.     Conjunctiva/sclera: Conjunctivae normal.     Pupils: Pupils are equal, round, and reactive to light.  Cardiovascular:     Rate and Rhythm: Normal rate and regular rhythm.     Pulses: Normal pulses.     Heart sounds: Normal heart sounds. No murmur heard. Pulmonary:     Effort: Pulmonary effort is normal.     Breath sounds: Normal breath sounds. No wheezing, rhonchi or rales.  Musculoskeletal:        General: Normal range of motion.     Cervical back: Normal range of motion and neck supple.     Comments: Right shoulder: TTP over AC and GH joints, moderate to severe limited range of motion with scapular retraction, forward flexion/extension, and horizontal abduction, no deformity noted  Skin:    General: Skin is warm and dry.  Neurological:     General: No focal deficit present.     Mental Status: He is alert and oriented to person, place, and time.      UC Treatments / Results  Labs (all labs ordered are listed, but only abnormal results are displayed) Labs Reviewed - No data to display  EKG   Radiology DG Shoulder Right  Result Date: 12/18/2021 CLINICAL DATA:  Chronic left shoulder pain. EXAM: RIGHT SHOULDER - 2+ VIEW COMPARISON:  Chest CT August 30, 2020. FINDINGS: There is no evidence of fracture or dislocation. Mild acromioclavicular degenerative change. Sclerotic lesion in the right humeral head is unchanged dating back to PET-CT March 24, 2020 consistent with a benign bone island. Soft tissues are unremarkable. IMPRESSION: No acute osseous abnormality. Mild acromioclavicular degenerative change. Electronically Signed   By: Maudry Mayhew M.D.   On: 12/18/2021 13:40    Procedures Procedures (including critical care time)  Medications Ordered in UC Medications - No data to display  Initial Impression / Assessment and Plan / UC Course  I have reviewed the triage vital signs and the nursing notes.  Pertinent labs & imaging results that were available during  my care of the patient were reviewed by me and considered in my medical decision making (see chart for details).     MDM: 1.  Right shoulder pain-right shoulder x-ray revealed above. Instructed patient to take medication as directed with  food to completion.  Advised patient to take prednisone with first dose of Celebrex for the next 10 of 15 days.  Advised patient for these medications to be backbone medications over the next 10 to 15 days.  Advised patient may take Percocet sparingly as needed for breakthrough right shoulder pain pain.  Encouraged patient to avoid heavy lifting pushing, pulling or repetitive use activities for the next 10 days.  Encouraged patient to increase daily water intake while taking these medications.  Advised patient if symptoms worsen and/or unresolved please follow-up with PCP or here for further evaluation.  Patient discharged home, hemodynamically stable. Final Clinical Impressions(s) / UC Diagnoses   Final diagnoses:  Acute pain of right shoulder     Discharge Instructions      Instructed patient to take medication as directed with food to completion.  Advised patient to take prednisone with first dose of Celebrex for the next 10 of 15 days.  Advised patient for these medications to be backbone medications over the next 10 to 15 days.  Advised patient may take Percocet sparingly as needed for breakthrough right shoulder pain pain.  Encouraged patient to avoid heavy lifting pushing, pulling or repetitive use activities for the next 10 days.  Encouraged patient to increase daily water intake while taking these medications.  Advised patient if symptoms worsen and/or unresolved please follow-up with PCP or here for further evaluation.     ED Prescriptions     Medication Sig Dispense Auth. Provider   celecoxib (CELEBREX) 100 MG capsule Take 1 capsule (100 mg total) by mouth 2 (two) times daily for 15 days. 30 capsule Trevor Iha, FNP   predniSONE (STERAPRED  UNI-PAK 21 TAB) 10 MG (21) TBPK tablet Take by mouth daily. Take 6 tabs by mouth daily  for 2 days, then 5 tabs for 2 days, then 4 tabs for 2 days, then 3 tabs for 2 days, 2 tabs for 2 days, then 1 tab by mouth daily for 2 days 42 tablet Trevor Iha, FNP   oxyCODONE-acetaminophen (PERCOCET/ROXICET) 5-325 MG tablet Take 1 tablet by mouth every 8 (eight) hours as needed for severe pain. 21 tablet Trevor Iha, FNP      I have reviewed the PDMP during this encounter.   Trevor Iha, FNP 12/18/21 1423

## 2021-12-18 NOTE — Discharge Instructions (Addendum)
Instructed patient to take medication as directed with food to completion.  Advised patient to take prednisone with first dose of Celebrex for the next 10 of 15 days.  Advised patient for these medications to be backbone medications over the next 10 to 15 days.  Advised patient may take Percocet sparingly as needed for breakthrough right shoulder pain pain.  Encouraged patient to avoid heavy lifting pushing, pulling or repetitive use activities for the next 10 days.  Encouraged patient to increase daily water intake while taking these medications.  Advised patient if symptoms worsen and/or unresolved please follow-up with PCP or here for further evaluation.

## 2021-12-19 ENCOUNTER — Telehealth: Payer: Self-pay | Admitting: Emergency Medicine

## 2021-12-19 NOTE — Telephone Encounter (Signed)
Spoke with patient states that he is feeling about the same but will continue medication as prescribed.  If no better in a couple of days, will follow up with either here or with PCP.

## 2022-01-03 ENCOUNTER — Ambulatory Visit: Payer: Self-pay | Admitting: Family Medicine

## 2022-01-05 ENCOUNTER — Ambulatory Visit (INDEPENDENT_AMBULATORY_CARE_PROVIDER_SITE_OTHER): Payer: Self-pay | Admitting: Family Medicine

## 2022-01-05 ENCOUNTER — Encounter: Payer: Self-pay | Admitting: Family Medicine

## 2022-01-05 DIAGNOSIS — G4700 Insomnia, unspecified: Secondary | ICD-10-CM

## 2022-01-05 MED ORDER — ALPRAZOLAM 1 MG PO TABS: ORAL_TABLET | ORAL | 1 refills | Status: DC

## 2022-01-05 MED ORDER — TRAZODONE HCL 100 MG PO TABS: 150.0000 mg | ORAL_TABLET | Freq: Every evening | ORAL | 3 refills | Status: DC | PRN

## 2022-01-05 NOTE — Patient Instructions (Signed)
Continue to taper back on alprazolam.  Take 1 tab qhs.  Continue trazodone 150mg  at bedtime.

## 2022-01-09 NOTE — Progress Notes (Signed)
Dylan Potter - 66 y.o. male MRN [id number]  Date of birth: 1955/09/06  Subjective Chief Complaint  Patient presents with   Insomnia    6 mth f/u    HPI Dylan Potter is a 66 year old male here today for follow-up of insomnia.  Current management with trazodone as well as alprazolam at bedtime.  He has been able to cut back on his alprazolam with addition of trazodone.  We discussed continue to work on cutting back on his alprazolam and previous visit.  He is a little unsure about this but willing to try.  ROS:  A comprehensive ROS was completed and negative except as noted per HPI  Allergies  Allergen Reactions   Levofloxacin In D5w Rash    Burning rash in genital area    Temazepam Other (See Comments)    No help with sleep   Zolpidem Other (See Comments)   Ambien [Zolpidem Tartrate]     Sleepwalking   Aspartame And Phenylalanine Nausea Only   Codeine Rash and Itching    Past Medical History:  Diagnosis Date   Cardiomyopathy (HCC)    History of anxiety    Kidney stone     Past Surgical History:  Procedure Laterality Date   DEBRIDEMENT TENNIS ELBOW  2001, 2002   HERNIA REPAIR  2000    Social History   Socioeconomic History   Marital status: Married    Spouse name: Not on file   Number of children: Not on file   Years of education: Not on file   Highest education level: Not on file  Occupational History   Not on file  Tobacco Use   Smoking status: Some Days    Packs/day: 0.50    Years: 40.00    Total pack years: 20.00    Types: Cigarettes   Smokeless tobacco: Never  Vaping Use   Vaping Use: Never used  Substance and Sexual Activity   Alcohol use: No   Drug use: No   Sexual activity: Yes  Other Topics Concern   Not on file  Social History Narrative   Not on file   Social Determinants of Health   Financial Resource Strain: Not on file  Food Insecurity: Not on file  Transportation Needs: Not on file  Physical Activity: Not on file  Stress: Not on file   Social Connections: Not on file    Family History  Problem Relation Age of Onset   Stroke Mother    Diabetes Mother    Hyperlipidemia Mother    Heart failure Father    Kidney failure Father     Health Maintenance  Topic Date Due   COVID-19 Vaccine (4 - Moderna series) 02/05/2022 (Originally 07/30/2020)   Zoster Vaccines- Shingrix (1 of 2) 02/05/2022 (Originally 09/16/2007)   INFLUENZA VACCINE  08/06/2022 (Originally 12/06/2021)   Hepatitis C Screening  01/06/2023 (Originally 09/16/1975)   HIV Screening  01/06/2023 (Originally 09/15/1972)   COLONOSCOPY (Pts 45-22yrs Insurance coverage will need to be confirmed)  02/06/2023   TETANUS/TDAP  01/07/2029   HPV VACCINES  Aged Out     ----------------------------------------------------------------------------------------------------------------------------------------------------------------------------------------------------------------- Physical Exam BP 106/70 (BP Location: Left Arm, Patient Position: Sitting, Cuff Size: Normal)   Pulse 73   Ht 5\' 10"  (1.778 m)   Wt 123 lb 6.4 oz (56 kg)   SpO2 98%   BMI 17.71 kg/m   Physical Exam Constitutional:      Appearance: Normal appearance.  Cardiovascular:     Rate and Rhythm: Normal rate and regular rhythm.  Pulmonary:     Effort: Pulmonary effort is normal.     Breath sounds: Normal breath sounds.  Musculoskeletal:     Cervical back: Neck supple.  Neurological:     Mental Status: He is alert.  Psychiatric:        Mood and Affect: Mood normal.        Behavior: Behavior normal.     ------------------------------------------------------------------------------------------------------------------------------------------------------------------------------------------------------------------- Assessment and Plan  Insomnia Trazodone has been effective for his insomnia we have been able to wean back on his alprazolam since adding this.  We will continue to cut back on this.   Reducing alprazolam to 1.5 tablets at bedtime to 1 tab at bedtime.   Meds ordered this encounter  Medications   ALPRAZolam (XANAX) 1 MG tablet    Sig: TAKE 1 Tablet BY MOUTH AT BEDTIME AS NEEDED FOR SLEEP OR ANXIETY    Dispense:  90 tablet    Refill:  1    Not to exceed 4 additional fills before 09/25/2021   traZODone (DESYREL) 100 MG tablet    Sig: Take 1.5 tablets (150 mg total) by mouth at bedtime as needed for sleep.    Dispense:  135 tablet    Refill:  3    Return in about 6 months (around 07/06/2022).    This visit occurred during the SARS-CoV-2 public health emergency.  Safety protocols were in place, including screening questions prior to the visit, additional usage of staff PPE, and extensive cleaning of exam room while observing appropriate contact time as indicated for disinfecting solutions.

## 2022-01-09 NOTE — Assessment & Plan Note (Signed)
Trazodone has been effective for his insomnia we have been able to wean back on his alprazolam since adding this.  We will continue to cut back on this.  Reducing alprazolam to 1.5 tablets at bedtime to 1 tab at bedtime.

## 2022-01-23 ENCOUNTER — Ambulatory Visit: Payer: Self-pay | Admitting: Family Medicine

## 2022-02-01 ENCOUNTER — Ambulatory Visit (INDEPENDENT_AMBULATORY_CARE_PROVIDER_SITE_OTHER): Payer: Self-pay | Admitting: Family Medicine

## 2022-02-01 VITALS — Temp 98.3°F

## 2022-02-01 DIAGNOSIS — Z23 Encounter for immunization: Secondary | ICD-10-CM

## 2022-02-15 ENCOUNTER — Encounter: Payer: Self-pay | Admitting: Family Medicine

## 2022-02-22 ENCOUNTER — Ambulatory Visit
Admission: RE | Admit: 2022-02-22 | Discharge: 2022-02-22 | Disposition: A | Discharge status: Home / Self Care | Payer: Self-pay | Source: Ambulatory Visit | Attending: Family Medicine | Admitting: Family Medicine

## 2022-02-22 VITALS — BP 114/84 | HR 85 | Temp 97.9°F | Resp 16

## 2022-02-22 DIAGNOSIS — M7581 Other shoulder lesions, right shoulder: Secondary | ICD-10-CM

## 2022-02-22 MED ORDER — OXYCODONE-ACETAMINOPHEN 5-325 MG PO TABS: 1.0000 | ORAL_TABLET | Freq: Four times a day (QID) | ORAL | 0 refills | Status: DC | PRN

## 2022-02-22 MED ORDER — PREDNISONE 50 MG PO TABS: ORAL_TABLET | ORAL | 0 refills | Status: DC

## 2022-02-22 MED ORDER — CELECOXIB 200 MG PO CAPS: 200.0000 mg | ORAL_CAPSULE | Freq: Every day | ORAL | 2 refills | Status: DC

## 2022-02-22 NOTE — Discharge Instructions (Addendum)
Take the prednisone once a day for 5 days Take Percocet if needed for severe pain Do not drive on Percocet After you have finished the prednisone, go on Celebrex. Call your doctor for refills

## 2022-02-22 NOTE — ED Provider Notes (Signed)
[facility]    CSN: [id number] Arrival date & time: 02/22/22  1259      History   Chief Complaint Chief Complaint  Patient presents with   Arm Injury    See Dr. about same pain when I was there on August 13th, needing refills that helped me at that time. Just pulled same muscles again - Entered by patient    HPI Dylan Potter is a 66 y.o. male.   HPI  Patient is here requesting a refill of Celebrex.  He would also like some prednisone.  He states he used his right arm for some work around the house and now has right shoulder pain again.  It is similar to his visit in August.  He states the pain is keeping him awake at night.  He states he called to get a refill of Celebrex from his primary care doctor but was told he needed an appointment.  He was unable to get 1 today, so came here to be seen.  He does not have health insurance.  Past Medical History:  Diagnosis Date   Cardiomyopathy [facility])    History of anxiety    Kidney stone     Patient Active Problem List   Diagnosis Date Noted   Age-related nuclear cataract of right eye 04/22/2018   Grade II hemorrhoids 03/28/2018   Emphysema of lung ([place]) 02/28/2018   Depression 02/28/2018   Diverticulosis 02/28/2018   Right renal stone 02/28/2018   Pulmonary infiltrate 11/01/2017   Cigarette nicotine dependence without complication 32/67/1245   Tobacco abuse disorder 09/05/2016   Other fatigue 09/05/2016   Hematuria 12/19/2013   Insomnia 07/01/2013   Esophageal stricture 03/31/2013   Generalized anxiety disorder 12/27/2012   History of anxiety    Benign prostatic hyperplasia 11/24/2010    Past Surgical History:  Procedure Laterality Date   DEBRIDEMENT TENNIS ELBOW  2001, 2002   HERNIA REPAIR  2000       Home Medications    Prior to Admission medications   Medication Sig Start Date End Date Taking? Authorizing Provider  celecoxib (CELEBREX) 200 MG capsule Take 1 capsule (200 mg total) by mouth daily.  02/22/22  Yes Raylene Everts, MD  oxyCODONE-acetaminophen (PERCOCET/ROXICET) 5-325 MG tablet Take 1 tablet by mouth every 6 (six) hours as needed for severe pain. 02/22/22  Yes Raylene Everts, MD  predniSONE (DELTASONE) 50 MG tablet Take once a day for 5 days.  Take with food 02/22/22  Yes Raylene Everts, MD  ALPRAZolam Duanne Moron) 1 MG tablet TAKE 1 Tablet BY MOUTH AT BEDTIME AS NEEDED FOR SLEEP OR ANXIETY 01/05/22   Luetta Nutting, DO  traZODone (DESYREL) 100 MG tablet Take 1.5 tablets (150 mg total) by mouth at bedtime as needed for sleep. 01/05/22   Luetta Nutting, DO    Family History Family History  Problem Relation Age of Onset   Stroke Mother    Diabetes Mother    Hyperlipidemia Mother    Heart failure Father    Kidney failure Father     Social History Social History   Tobacco Use   Smoking status: Some Days    Packs/day: 0.50    Years: 40.00    Total pack years: 20.00    Types: Cigarettes   Smokeless tobacco: Never  Vaping Use   Vaping Use: Never used  Substance Use Topics   Alcohol use: No   Drug use: No     Allergies   Levofloxacin in d5w,  Temazepam, Zolpidem, Ambien [zolpidem tartrate], Aspartame and phenylalanine, and Codeine   Review of Systems Review of Systems See HPI  Physical Exam Triage Vital Signs ED Triage Vitals [02/22/22 1317]  Enc Vitals Group     BP 114/84     Pulse Rate 85     Resp 16     Temp 97.9 F (36.6 C)     Temp Source Oral     SpO2 98 %     Weight      Height      Head Circumference      Peak Flow      Pain Score 7     Pain Loc      Pain Edu?      Excl. in GC?    No data found.  Updated Vital Signs BP 114/84 (BP Location: Right Arm)   Pulse 85   Temp 97.9 F (36.6 C) (Oral)   Resp 16   SpO2 98%   Physical Exam Constitutional:      General: He is not in acute distress.    Appearance: He is well-developed and normal weight.  HENT:     Head: Normocephalic and atraumatic.  Eyes:     Conjunctiva/sclera:  Conjunctivae normal.     Pupils: Pupils are equal, round, and reactive to light.  Cardiovascular:     Rate and Rhythm: Normal rate.  Pulmonary:     Effort: Pulmonary effort is normal. No respiratory distress.  Abdominal:     General: There is no distension.     Palpations: Abdomen is soft.  Musculoskeletal:        General: Normal range of motion.     Cervical back: Normal range of motion.     Comments: Patient can abduct the right shoulder to 90 degrees but has pain with any attempted external rotation.  Internal rotation is normal.  He can forward flex his shoulder to 90 degrees.  Mild tenderness in the posterior shoulder  Skin:    General: Skin is warm and dry.  Neurological:     Mental Status: He is alert.      UC Treatments / Results  Labs (all labs ordered are listed, but only abnormal results are displayed) Labs Reviewed - No data to display  EKG   Radiology No results found.  Procedures Procedures (including critical care time)  Medications Ordered in UC Medications - No data to display  Initial Impression / Assessment and Plan / UC Course  I have reviewed the triage vital signs and the nursing notes.  Pertinent labs & imaging results that were available during my care of the patient were reviewed by me and considered in my medical decision making (see chart for details).    Advised to see Dr. Benjamin Stain if problem persists Final Clinical Impressions(s) / UC Diagnoses   Final diagnoses:  Rotator cuff tendinitis, right     Discharge Instructions      Take the prednisone once a day for 5 days Take Percocet if needed for severe pain Do not drive on Percocet After you have finished the prednisone, go on Celebrex. Call your doctor for refills   ED Prescriptions     Medication Sig Dispense Auth. Provider   predniSONE (DELTASONE) 50 MG tablet Take once a day for 5 days.  Take with food 5 tablet Eustace Moore, MD   celecoxib (CELEBREX) 200 MG  capsule Take 1 capsule (200 mg total) by mouth daily. 30 capsule Eustace Moore, MD  oxyCODONE-acetaminophen (PERCOCET/ROXICET) 5-325 MG tablet Take 1 tablet by mouth every 6 (six) hours as needed for severe pain. 10 tablet Eustace Moore, MD      I have reviewed the PDMP during this encounter.   Eustace Moore, MD 02/22/22 [id number]

## 2022-02-22 NOTE — ED Triage Notes (Signed)
Pt states he was seen in august for right arm pain and arm felt better but over the last week he has pain in right arm again. Denies known injury. Taking naproxen with no relief.

## 2022-06-08 ENCOUNTER — Ambulatory Visit (INDEPENDENT_AMBULATORY_CARE_PROVIDER_SITE_OTHER): Payer: Self-pay | Admitting: Family Medicine

## 2022-06-08 ENCOUNTER — Encounter: Payer: Self-pay | Admitting: Family Medicine

## 2022-06-08 VITALS — BP 113/76 | HR 82 | Ht 70.0 in | Wt 128.0 lb

## 2022-06-08 DIAGNOSIS — F5104 Psychophysiologic insomnia: Secondary | ICD-10-CM

## 2022-06-08 MED ORDER — PREDNISONE 10 MG (48) PO TBPK: ORAL_TABLET | ORAL | 0 refills | Status: DC

## 2022-06-08 MED ORDER — QUETIAPINE FUMARATE 50 MG PO TABS: 50.0000 mg | ORAL_TABLET | Freq: Every day | ORAL | 1 refills | Status: DC

## 2022-06-08 NOTE — Progress Notes (Signed)
Dylan Potter - 67 y.o. male MRN [id number]  Date of birth: [birth year]  Subjective Chief Complaint  Patient presents with   Insomnia    HPI Dylan Potter is a 67 year old male here today for follow-up visit.    Following up today for insomnia.  Reports that trazodone does not seem to be very effective for his sleep.  He feels that previous strength of alprazolam at 1.5 mg nightly was working better for him.  He does have difficulty with falling asleep and staying asleep.  He does not recall having side effects at higher strength of alprazolam.  ROS:  A comprehensive ROS was completed and negative except as noted per HPI  Allergies  Allergen Reactions   Levofloxacin In D5w Rash    Burning rash in genital area    Temazepam Other (See Comments)    No help with sleep   Zolpidem Other (See Comments)   Ambien [Zolpidem Tartrate]     Sleepwalking   Aspartame And Phenylalanine Nausea Only   Codeine Rash and Itching    Past Medical History:  Diagnosis Date   Cardiomyopathy ([place])    History of anxiety    Kidney stone     Past Surgical History:  Procedure Laterality Date   DEBRIDEMENT TENNIS ELBOW  2001, 2002   HERNIA REPAIR  2000    Social History   Socioeconomic History   Marital status: Married    Spouse name: Not on file   Number of children: Not on file   Years of education: Not on file   Highest education level: Not on file  Occupational History   Not on file  Tobacco Use   Smoking status: Some Days    Packs/day: 0.50    Years: 40.00    Total pack years: 20.00    Types: Cigarettes   Smokeless tobacco: Never  Vaping Use   Vaping Use: Never used  Substance and Sexual Activity   Alcohol use: No   Drug use: No   Sexual activity: Yes  Other Topics Concern   Not on file  Social History Narrative   Not on file   Social Determinants of Health   Financial Resource Strain: Not on file  Food Insecurity: Not on file  Transportation Needs: Not on file  Physical  Activity: Not on file  Stress: Not on file  Social Connections: Not on file    Family History  Problem Relation Age of Onset   Stroke Mother    Diabetes Mother    Hyperlipidemia Mother    Heart failure Father    Kidney failure Father     Health Maintenance  Topic Date Due   Zoster Vaccines- Shingrix (1 of 2) 09/06/2022 (Originally 09/16/2007)   Lung Cancer Screening  10/07/2022 (Originally 08/30/2021)   Hepatitis C Screening  01/06/2023 (Originally 09/16/1975)   HIV Screening  01/06/2023 (Originally 09/15/1972)   COVID-19 Vaccine (4 - 2023-24 season) 06/25/2023 (Originally 01/06/2022)   COLONOSCOPY (Pts 45-63yrs Insurance coverage will need to be confirmed)  02/06/2023   DTaP/Tdap/Td (2 - Td or Tdap) 01/07/2029   INFLUENZA VACCINE  Completed   HPV VACCINES  Aged Out     ----------------------------------------------------------------------------------------------------------------------------------------------------------------------------------------------------------------- Physical Exam BP 113/76 (BP Location: Left Arm, Patient Position: Sitting, Cuff Size: Small)   Pulse 82   Ht 5\' 10"  (1.778 m)   Wt 128 lb (58.1 kg)   SpO2 98%   BMI 18.37 kg/m   Physical Exam Constitutional:      Appearance: Normal appearance.  HENT:     Head: Normocephalic and atraumatic.  Neurological:     Mental Status: He is alert.  Psychiatric:        Mood and Affect: Mood normal.        Behavior: Behavior normal.     ------------------------------------------------------------------------------------------------------------------------------------------------------------------------------------------------------------------- Assessment and Plan  Insomnia He has tried multiple sleep medications in the past with either side effects or ineffectiveness.  He does not recall trying Seroquel.  Adding Seroquel 50 to 100 mg nightly.  He may continue 1 mg of alprazolam as well.  Goal would be to  continue to wean this back.   Meds ordered this encounter  Medications   QUEtiapine (SEROQUEL) 50 MG tablet    Sig: Take 1-2 tablets (50-100 mg total) by mouth at bedtime.    Dispense:  135 tablet    Refill:  1   predniSONE (STERAPRED UNI-PAK 48 TAB) 10 MG (48) TBPK tablet    Sig: Take as directed on packaging.  12 day taper    Dispense:  48 tablet    Refill:  0    Return in about 6 months (around 12/07/2022) for insomnia.    This visit occurred during the SARS-CoV-2 public health emergency.  Safety protocols were in place, including screening questions prior to the visit, additional usage of staff PPE, and extensive cleaning of exam room while observing appropriate contact time as indicated for disinfecting solutions.

## 2022-06-08 NOTE — Assessment & Plan Note (Signed)
He has tried multiple sleep medications in the past with either side effects or ineffectiveness.  He does not recall trying Seroquel.  Adding Seroquel 50 to 100 mg nightly.  He may continue 1 mg of alprazolam as well.  Goal would be to continue to wean this back.

## 2022-06-09 ENCOUNTER — Encounter: Payer: Self-pay | Admitting: Family Medicine

## 2022-07-06 ENCOUNTER — Encounter: Payer: Self-pay | Admitting: Family Medicine

## 2022-07-06 ENCOUNTER — Other Ambulatory Visit: Payer: Self-pay | Admitting: Family Medicine

## 2022-07-06 DIAGNOSIS — G4700 Insomnia, unspecified: Secondary | ICD-10-CM

## 2022-07-20 ENCOUNTER — Other Ambulatory Visit: Payer: Self-pay

## 2022-07-21 ENCOUNTER — Other Ambulatory Visit: Payer: Self-pay

## 2022-07-21 DIAGNOSIS — G4700 Insomnia, unspecified: Secondary | ICD-10-CM

## 2022-07-24 MED ORDER — TRAZODONE HCL 100 MG PO TABS: 150.0000 mg | ORAL_TABLET | Freq: Every evening | ORAL | 1 refills | Status: DC | PRN

## 2022-07-24 NOTE — Progress Notes (Signed)
Completed.

## 2022-08-15 ENCOUNTER — Encounter: Payer: Self-pay | Admitting: Family Medicine

## 2022-08-18 ENCOUNTER — Other Ambulatory Visit: Payer: Self-pay

## 2022-08-18 DIAGNOSIS — G4700 Insomnia, unspecified: Secondary | ICD-10-CM

## 2022-08-22 ENCOUNTER — Other Ambulatory Visit: Payer: Self-pay

## 2022-08-22 DIAGNOSIS — G4700 Insomnia, unspecified: Secondary | ICD-10-CM

## 2022-08-22 MED ORDER — TRAZODONE HCL 100 MG PO TABS: 150.0000 mg | ORAL_TABLET | Freq: Every evening | ORAL | 1 refills | Status: DC | PRN

## 2022-08-22 MED ORDER — ALPRAZOLAM 1 MG PO TABS: 1.5000 mg | ORAL_TABLET | Freq: Every evening | ORAL | 1 refills | Status: DC | PRN

## 2022-08-22 NOTE — Progress Notes (Signed)
Updated rx sent in.

## 2022-09-06 DIAGNOSIS — H524 Presbyopia: Secondary | ICD-10-CM | POA: Diagnosis not present

## 2022-09-06 DIAGNOSIS — H2513 Age-related nuclear cataract, bilateral: Secondary | ICD-10-CM | POA: Diagnosis not present

## 2022-09-11 DIAGNOSIS — R140 Abdominal distension (gaseous): Secondary | ICD-10-CM | POA: Diagnosis not present

## 2022-09-11 DIAGNOSIS — R1310 Dysphagia, unspecified: Secondary | ICD-10-CM | POA: Diagnosis not present

## 2022-09-11 DIAGNOSIS — K625 Hemorrhage of anus and rectum: Secondary | ICD-10-CM | POA: Diagnosis not present

## 2022-09-11 DIAGNOSIS — K5900 Constipation, unspecified: Secondary | ICD-10-CM | POA: Diagnosis not present

## 2022-09-11 DIAGNOSIS — R194 Change in bowel habit: Secondary | ICD-10-CM | POA: Diagnosis not present

## 2022-09-11 DIAGNOSIS — K581 Irritable bowel syndrome with constipation: Secondary | ICD-10-CM | POA: Diagnosis not present

## 2022-09-11 DIAGNOSIS — R1013 Epigastric pain: Secondary | ICD-10-CM | POA: Diagnosis not present

## 2022-09-11 DIAGNOSIS — F17200 Nicotine dependence, unspecified, uncomplicated: Secondary | ICD-10-CM | POA: Diagnosis not present

## 2022-09-12 ENCOUNTER — Ambulatory Visit (INDEPENDENT_AMBULATORY_CARE_PROVIDER_SITE_OTHER): Payer: Medicare HMO | Admitting: Family Medicine

## 2022-09-12 ENCOUNTER — Encounter: Payer: Self-pay | Admitting: Family Medicine

## 2022-09-12 VITALS — BP 116/77 | HR 118 | Ht 70.0 in | Wt 129.0 lb

## 2022-09-12 DIAGNOSIS — R5383 Other fatigue: Secondary | ICD-10-CM | POA: Diagnosis not present

## 2022-09-12 DIAGNOSIS — Z136 Encounter for screening for cardiovascular disorders: Secondary | ICD-10-CM | POA: Diagnosis not present

## 2022-09-12 DIAGNOSIS — Z13220 Encounter for screening for lipoid disorders: Secondary | ICD-10-CM | POA: Diagnosis not present

## 2022-09-12 DIAGNOSIS — N400 Enlarged prostate without lower urinary tract symptoms: Secondary | ICD-10-CM

## 2022-09-12 DIAGNOSIS — R911 Solitary pulmonary nodule: Secondary | ICD-10-CM | POA: Diagnosis not present

## 2022-09-12 DIAGNOSIS — Z720 Tobacco use: Secondary | ICD-10-CM

## 2022-09-12 DIAGNOSIS — Z0000 Encounter for general adult medical examination without abnormal findings: Secondary | ICD-10-CM | POA: Diagnosis not present

## 2022-09-12 DIAGNOSIS — E7849 Other hyperlipidemia: Secondary | ICD-10-CM | POA: Diagnosis not present

## 2022-09-12 NOTE — Assessment & Plan Note (Signed)
Well adult Orders Placed This Encounter  Procedures   CT Chest Wo Contrast    Standing Status:   Future    Standing Expiration Date:   09/12/2023    Order Specific Question:   Preferred imaging location?    Answer:   [facility]   [facility]    Standing Status:   Future    Standing Expiration Date:   09/12/2023    Order Specific Question:   Reason for Exam (SYMPTOM  OR DIAGNOSIS REQUIRED)    Answer:   Smoker, AAA screening    Order Specific Question:   Preferred imaging location?    Answer:   [facility]   COMPLETE METABOLIC PANEL WITH GFR   CBC with Differential   Lipid Panel w/reflex Direct LDL   TSH   PSA  Screenings:: Per lab orders.  AAA screening ordered Immunizations: Up-to-date Anticipatory guidance/risk factor reduction: Recommendations per AVS

## 2022-09-12 NOTE — Assessment & Plan Note (Signed)
Follow-up CT scan of the chest without contrast ordered.

## 2022-09-12 NOTE — Progress Notes (Signed)
Dylan Potter - 67 y.o. male MRN [id number]  Date of birth: Jun 14, 1955  Subjective Chief Complaint  Patient presents with   Annual Exam    HPI Dylan Potter is a 67 y.o. male here today for annual exam.  He reports that he has had some GI issues recently.  He was seen by gastroenterology yesterday and they are planning on doing endoscopy and colonoscopy due to acute bowel changes and reflux symptoms.  Reports that this is not scheduled until August.  We were able to contact his gastroenterologist and they will try to move this up.  He is moderately active.  Appetite has not been very good over the past few days but typically follows a pretty good diet.  He is a current smoker.  Approximate 25-pack-year history.  He was previously seen pulmonary for pulmonary nodule and is overdue for repeat CT scan.  He has never had AAA screening.  Denies alcohol at this time.  Review of Systems  Constitutional:  Negative for chills, fever, malaise/fatigue and weight loss.  HENT:  Negative for congestion, ear pain and sore throat.   Eyes:  Negative for blurred vision, double vision and pain.  Respiratory:  Negative for cough and shortness of breath.   Cardiovascular:  Negative for chest pain and palpitations.  Gastrointestinal:  Negative for abdominal pain, blood in stool, constipation, heartburn and nausea.  Genitourinary:  Negative for dysuria and urgency.  Musculoskeletal:  Negative for joint pain and myalgias.  Neurological:  Negative for dizziness and headaches.  Endo/Heme/Allergies:  Does not bruise/bleed easily.  Psychiatric/Behavioral:  Negative for depression. The patient is not nervous/anxious and does not have insomnia.     Allergies  Allergen Reactions   Levofloxacin In D5w Rash    Burning rash in genital area    Temazepam Other (See Comments)    No help with sleep   Zolpidem Other (See Comments)   Ambien [Zolpidem Tartrate]     Sleepwalking   Aspartame And Phenylalanine Nausea Only    Codeine Rash and Itching    Past Medical History:  Diagnosis Date   Cardiomyopathy ([facility])    History of anxiety    Kidney stone     Past Surgical History:  Procedure Laterality Date   DEBRIDEMENT TENNIS ELBOW  2001, 2002   HERNIA REPAIR  2000    Social History   Socioeconomic History   Marital status: Married    Spouse name: Not on file   Number of children: Not on file   Years of education: Not on file   Highest education level: Not on file  Occupational History   Not on file  Tobacco Use   Smoking status: Some Days    Packs/day: 0.50    Years: 40.00    Additional pack years: 0.00    Total pack years: 20.00    Types: Cigarettes   Smokeless tobacco: Never  Vaping Use   Vaping Use: Never used  Substance and Sexual Activity   Alcohol use: No   Drug use: No   Sexual activity: Yes  Other Topics Concern   Not on file  Social History Narrative   Not on file   Social Determinants of Health   Financial Resource Strain: Not on file  Food Insecurity: Not on file  Transportation Needs: Not on file  Physical Activity: Not on file  Stress: Not on file  Social Connections: Not on file    Family History  Problem Relation Age of Onset   Stroke  Mother    Diabetes Mother    Hyperlipidemia Mother    Heart failure Father    Kidney failure Father     Health Maintenance  Topic Date Due   Lung Cancer Screening  10/07/2022 (Originally 08/30/2021)   Medicare Annual Wellness (AWV)  12/07/2022 (Originally [birth year])   Zoster Vaccines- Shingrix (1 of 2) 12/13/2022 (Originally 09/16/2007)   Hepatitis C Screening  01/06/2023 (Originally 09/16/1975)   HIV Screening  01/06/2023 (Originally 09/15/1972)   COVID-19 Vaccine (4 - 2023-24 season) 06/25/2023 (Originally 01/06/2022)   INFLUENZA VACCINE  12/07/2022   COLONOSCOPY (Pts 45-45yrs Insurance coverage will need to be confirmed)  02/06/2023   DTaP/Tdap/Td (2 - Td or Tdap) 01/07/2029   HPV VACCINES  Aged Out      ----------------------------------------------------------------------------------------------------------------------------------------------------------------------------------------------------------------- Physical Exam BP 116/77 (BP Location: Left Arm, Patient Position: Sitting, Cuff Size: Normal)   Pulse (!) 118   Ht 5\' 10"  (1.778 m)   Wt 129 lb (58.5 kg)   SpO2 96%   BMI 18.51 kg/m   Physical Exam Constitutional:      General: He is not in acute distress. HENT:     Head: Normocephalic and atraumatic.     Right Ear: Tympanic membrane and external ear normal.     Left Ear: Tympanic membrane and external ear normal.  Eyes:     General: No scleral icterus. Neck:     Thyroid: No thyromegaly.  Cardiovascular:     Rate and Rhythm: Normal rate and regular rhythm.     Heart sounds: Normal heart sounds.  Pulmonary:     Effort: Pulmonary effort is normal.     Breath sounds: Normal breath sounds.  Abdominal:     General: Bowel sounds are normal. There is no distension.     Palpations: Abdomen is soft.     Tenderness: There is no abdominal tenderness. There is no guarding.  Musculoskeletal:     Cervical back: Normal range of motion.  Lymphadenopathy:     Cervical: No cervical adenopathy.  Skin:    General: Skin is warm and dry.     Findings: No rash.  Neurological:     Mental Status: He is alert and oriented to person, place, and time.     Cranial Nerves: No cranial nerve deficit.     Motor: No abnormal muscle tone.  Psychiatric:        Mood and Affect: Mood normal.        Behavior: Behavior normal.     ------------------------------------------------------------------------------------------------------------------------------------------------------------------------------------------------------------------- Assessment and Plan  Incidental lung nodule, greater than or equal to 8mm Follow-up CT scan of the chest without contrast ordered.  Well adult  exam Well adult Orders Placed This Encounter  Procedures   CT Chest Wo Contrast    Standing Status:   Future    Standing Expiration Date:   09/12/2023    Order Specific Question:   Preferred imaging location?    Answer:   [facility]   [facility]    Standing Status:   Future    Standing Expiration Date:   09/12/2023    Order Specific Question:   Reason for Exam (SYMPTOM  OR DIAGNOSIS REQUIRED)    Answer:   Smoker, AAA screening    Order Specific Question:   Preferred imaging location?    Answer:   [facility]   COMPLETE METABOLIC PANEL WITH GFR   CBC with Differential   Lipid Panel w/reflex Direct LDL   TSH   PSA  Screenings::  Per lab orders.  AAA screening ordered Immunizations: Up-to-date Anticipatory guidance/risk factor reduction: Recommendations per AVS   No orders of the defined types were placed in this encounter.   No follow-ups on file.    This visit occurred during the SARS-CoV-2 public health emergency.  Safety protocols were in place, including screening questions prior to the visit, additional usage of staff PPE, and extensive cleaning of exam room while observing appropriate contact time as indicated for disinfecting solutions.

## 2022-09-12 NOTE — Patient Instructions (Signed)

## 2022-09-13 LAB — CBC WITH DIFFERENTIAL/PLATELET
Absolute Monocytes: 230 cells/uL (ref 200–950)
Basophils Absolute: 43 cells/uL (ref 0–200)
Basophils Relative: 0.3 %
Eosinophils Absolute: 43 cells/uL (ref 15–500)
Eosinophils Relative: 0.3 %
HCT: 49.2 % (ref 38.5–50.0)
Hemoglobin: 16.7 g/dL (ref 13.2–17.1)
Lymphs Abs: 1022 cells/uL (ref 850–3900)
MCH: 31.2 pg (ref 27.0–33.0)
MCHC: 33.9 g/dL (ref 32.0–36.0)
MCV: 92 fL (ref 80.0–100.0)
MPV: 9.7 fL (ref 7.5–12.5)
Monocytes Relative: 1.6 %
Neutro Abs: 13061 cells/uL — ABNORMAL HIGH (ref 1500–7800)
Neutrophils Relative %: 90.7 %
Platelets: 202 10*3/uL (ref 140–400)
RBC: 5.35 10*6/uL (ref 4.20–5.80)
RDW: 13.7 % (ref 11.0–15.0)
Total Lymphocyte: 7.1 %
WBC: 14.4 10*3/uL — ABNORMAL HIGH (ref 3.8–10.8)

## 2022-09-13 LAB — PSA: PSA: 3.43 ng/mL (ref <=4.00)

## 2022-09-13 LAB — COMPLETE METABOLIC PANEL WITH GFR
AG Ratio: 1.9 (calc) (ref 1.0–2.5)
ALT: 29 U/L (ref 9–46)
AST: 32 U/L (ref 10–35)
Albumin: 4.4 g/dL (ref 3.6–5.1)
Alkaline phosphatase (APISO): 71 U/L (ref 35–144)
BUN: 22 mg/dL (ref 7–25)
CO2: 27 mmol/L (ref 20–32)
Calcium: 9.9 mg/dL (ref 8.6–10.3)
Chloride: 105 mmol/L (ref 98–110)
Creat: 1.14 mg/dL (ref 0.70–1.35)
Globulin: 2.3 g/dL (calc) (ref 1.9–3.7)
Glucose, Bld: 85 mg/dL (ref 65–99)
Potassium: 4.3 mmol/L (ref 3.5–5.3)
Sodium: 141 mmol/L (ref 135–146)
Total Bilirubin: 0.6 mg/dL (ref 0.2–1.2)
Total Protein: 6.7 g/dL (ref 6.1–8.1)
eGFR: 72 mL/min/{1.73_m2} (ref >=60)

## 2022-09-13 LAB — LIPID PANEL W/REFLEX DIRECT LDL
Cholesterol: 219 mg/dL — ABNORMAL HIGH (ref <200)
HDL: 45 mg/dL (ref >=40)
LDL Cholesterol (Calc): 130 mg/dL (calc) — ABNORMAL HIGH
Non-HDL Cholesterol (Calc): 174 mg/dL (calc) — ABNORMAL HIGH (ref <130)
Total CHOL/HDL Ratio: 4.9 (calc) (ref <5.0)
Triglycerides: 309 mg/dL — ABNORMAL HIGH (ref <150)

## 2022-09-13 LAB — TSH: TSH: 1.65 mIU/L (ref 0.40–4.50)

## 2022-09-18 DIAGNOSIS — K921 Melena: Secondary | ICD-10-CM | POA: Diagnosis not present

## 2022-09-18 DIAGNOSIS — R1310 Dysphagia, unspecified: Secondary | ICD-10-CM | POA: Diagnosis not present

## 2022-09-18 DIAGNOSIS — K2950 Unspecified chronic gastritis without bleeding: Secondary | ICD-10-CM | POA: Diagnosis not present

## 2022-09-18 DIAGNOSIS — K635 Polyp of colon: Secondary | ICD-10-CM | POA: Diagnosis not present

## 2022-09-18 LAB — HM COLONOSCOPY

## 2022-09-20 ENCOUNTER — Ambulatory Visit: Payer: Medicare HMO

## 2022-09-20 ENCOUNTER — Other Ambulatory Visit: Payer: Medicare HMO

## 2022-09-21 ENCOUNTER — Encounter: Payer: Self-pay | Admitting: Family Medicine

## 2022-09-21 ENCOUNTER — Other Ambulatory Visit: Payer: Self-pay | Admitting: Family Medicine

## 2022-09-21 DIAGNOSIS — D72829 Elevated white blood cell count, unspecified: Secondary | ICD-10-CM

## 2022-10-04 ENCOUNTER — Encounter: Payer: Self-pay | Admitting: Family Medicine

## 2022-10-04 ENCOUNTER — Ambulatory Visit (INDEPENDENT_AMBULATORY_CARE_PROVIDER_SITE_OTHER): Payer: Medicare HMO

## 2022-10-04 DIAGNOSIS — Z136 Encounter for screening for cardiovascular disorders: Secondary | ICD-10-CM

## 2022-10-04 DIAGNOSIS — Z720 Tobacco use: Secondary | ICD-10-CM

## 2022-10-04 DIAGNOSIS — J479 Bronchiectasis, uncomplicated: Secondary | ICD-10-CM | POA: Diagnosis not present

## 2022-10-04 DIAGNOSIS — F17210 Nicotine dependence, cigarettes, uncomplicated: Secondary | ICD-10-CM | POA: Diagnosis not present

## 2022-10-04 DIAGNOSIS — R911 Solitary pulmonary nodule: Secondary | ICD-10-CM

## 2022-10-04 DIAGNOSIS — J432 Centrilobular emphysema: Secondary | ICD-10-CM | POA: Diagnosis not present

## 2022-10-04 DIAGNOSIS — Z87891 Personal history of nicotine dependence: Secondary | ICD-10-CM | POA: Diagnosis not present

## 2022-10-10 ENCOUNTER — Encounter: Payer: Self-pay | Admitting: Family Medicine

## 2022-10-16 ENCOUNTER — Encounter: Payer: Self-pay | Admitting: Family Medicine

## 2022-10-17 ENCOUNTER — Other Ambulatory Visit: Payer: Self-pay | Admitting: Family Medicine

## 2022-10-17 MED ORDER — AMOXICILLIN-POT CLAVULANATE 875-125 MG PO TABS: 1.0000 | ORAL_TABLET | Freq: Two times a day (BID) | ORAL | 0 refills | Status: DC

## 2022-10-20 DIAGNOSIS — L821 Other seborrheic keratosis: Secondary | ICD-10-CM | POA: Diagnosis not present

## 2022-10-20 DIAGNOSIS — C44319 Basal cell carcinoma of skin of other parts of face: Secondary | ICD-10-CM | POA: Diagnosis not present

## 2022-10-20 DIAGNOSIS — C44311 Basal cell carcinoma of skin of nose: Secondary | ICD-10-CM | POA: Diagnosis not present

## 2022-10-20 DIAGNOSIS — C44619 Basal cell carcinoma of skin of left upper limb, including shoulder: Secondary | ICD-10-CM | POA: Diagnosis not present

## 2022-10-20 DIAGNOSIS — Z85828 Personal history of other malignant neoplasm of skin: Secondary | ICD-10-CM | POA: Diagnosis not present

## 2022-10-20 DIAGNOSIS — D492 Neoplasm of unspecified behavior of bone, soft tissue, and skin: Secondary | ICD-10-CM | POA: Diagnosis not present

## 2022-10-31 ENCOUNTER — Encounter: Payer: Self-pay | Admitting: Family Medicine

## 2022-11-21 DIAGNOSIS — C44619 Basal cell carcinoma of skin of left upper limb, including shoulder: Secondary | ICD-10-CM | POA: Diagnosis not present

## 2022-12-07 ENCOUNTER — Ambulatory Visit: Payer: Medicare HMO | Admitting: Family Medicine

## 2022-12-13 DIAGNOSIS — C44319 Basal cell carcinoma of skin of other parts of face: Secondary | ICD-10-CM | POA: Diagnosis not present

## 2022-12-27 DIAGNOSIS — C44319 Basal cell carcinoma of skin of other parts of face: Secondary | ICD-10-CM | POA: Diagnosis not present

## 2023-01-11 ENCOUNTER — Encounter: Payer: Self-pay | Admitting: Family Medicine

## 2023-01-11 ENCOUNTER — Ambulatory Visit (INDEPENDENT_AMBULATORY_CARE_PROVIDER_SITE_OTHER): Payer: Medicare HMO | Admitting: Family Medicine

## 2023-01-11 VITALS — BP 118/72 | HR 76 | Ht 70.0 in | Wt 130.0 lb

## 2023-01-11 DIAGNOSIS — Z0000 Encounter for general adult medical examination without abnormal findings: Secondary | ICD-10-CM

## 2023-01-11 DIAGNOSIS — Z23 Encounter for immunization: Secondary | ICD-10-CM | POA: Diagnosis not present

## 2023-01-11 MED ORDER — TRAZODONE HCL 150 MG PO TABS: 150.0000 mg | ORAL_TABLET | Freq: Every day | ORAL | 1 refills | Status: DC

## 2023-01-11 MED ORDER — QUETIAPINE FUMARATE 50 MG PO TABS: 50.0000 mg | ORAL_TABLET | Freq: Every day | ORAL | 1 refills | Status: DC

## 2023-01-11 NOTE — Patient Instructions (Signed)
  Dylan Potter , Thank you for taking time to come for your Medicare Wellness Visit. I appreciate your ongoing commitment to your health goals. Please review the following plan we discussed and let me know if I can assist you in the future.   These are the goals we discussed:  Goals   None     This is a list of the screening recommended for you and due dates:  Health Maintenance  Topic Date Due   HIV Screening  Never done   Hepatitis C Screening  Never done   Zoster (Shingles) Vaccine (1 of 2) Never done   Pneumonia Vaccine (2 of 2 - PCV) 05/08/2008   Flu Shot  12/07/2022   COVID-19 Vaccine (4 - 2023-24 season) 01/07/2023   Screening for Lung Cancer  10/04/2023   Medicare Annual Wellness Visit  01/11/2024   DTaP/Tdap/Td vaccine (2 - Td or Tdap) 01/07/2029   Colon Cancer Screening  09/17/2032   HPV Vaccine  Aged Out

## 2023-01-11 NOTE — Progress Notes (Signed)
Subjective:   Dylan Potter is a 67 y.o. male who presents for a Welcome to Medicare exam.  He reports that he is doing well.  He lives in a single story house with his wife. He is retired He has support from family.    Review of Systems  Constitutional:  Negative for chills, fever, malaise/fatigue and weight loss.  HENT:  Negative for congestion, ear pain and sore throat.   Eyes:  Negative for blurred vision, double vision and pain.  Respiratory:  Negative for cough and shortness of breath.   Cardiovascular:  Negative for chest pain and palpitations.  Gastrointestinal:  Negative for abdominal pain, blood in stool, constipation, heartburn and nausea.  Genitourinary:  Negative for dysuria and urgency.  Musculoskeletal:  Negative for joint pain and myalgias.  Neurological:  Negative for dizziness and headaches.  Endo/Heme/Allergies:  Does not bruise/bleed easily.  Psychiatric/Behavioral:  Negative for depression. The patient is not nervous/anxious and does not have insomnia.            Objective:    Today's Vitals   01/11/23 1346  BP: 118/72  Pulse: 76  SpO2: 96%  Weight: 130 lb (59 kg)  Height: 5\' 10"  (1.778 m)   Body mass index is 18.65 kg/m.  Medications Outpatient Encounter Medications as of 01/11/2023  Medication Sig   ALPRAZolam (XANAX) 1 MG tablet Take 1.5 tablets (1.5 mg total) by mouth at bedtime as needed for anxiety.   pantoprazole (PROTONIX) 40 MG tablet Take 40 mg by mouth at bedtime.   traZODone (DESYREL) 150 MG tablet Take 1 tablet (150 mg total) by mouth at bedtime.   [DISCONTINUED] QUEtiapine (SEROQUEL) 50 MG tablet Take 1-2 tablets (50-100 mg total) by mouth at bedtime.   [DISCONTINUED] traZODone (DESYREL) 100 MG tablet Take 1.5 tablets (150 mg total) by mouth at bedtime as needed for sleep.   QUEtiapine (SEROQUEL) 50 MG tablet Take 1-2 tablets (50-100 mg total) by mouth at bedtime.   [DISCONTINUED] amoxicillin-clavulanate (AUGMENTIN) 875-125 MG tablet Take  1 tablet by mouth 2 (two) times daily.   [DISCONTINUED] linaclotide (LINZESS) 145 MCG CAPS capsule Take 145 mcg by mouth daily before breakfast.   No facility-administered encounter medications on file as of 01/11/2023.     History: Past Medical History:  Diagnosis Date   BCC (basal cell carcinoma of skin)    Cardiomyopathy ([facility])    History of anxiety    Kidney stone    Past Surgical History:  Procedure Laterality Date   DEBRIDEMENT TENNIS ELBOW  2001, 2002   HERNIA REPAIR  05/08/1998   SKIN SURGERY     BCC-Elbow and Face    Family History  Problem Relation Age of Onset   Stroke Mother    Diabetes Mother    Hyperlipidemia Mother    Heart failure Father    Kidney failure Father    Social History   Occupational History   Not on file  Tobacco Use   Smoking status: Some Days    Current packs/day: 0.50    Average packs/day: 0.5 packs/day for 40.0 years (20.0 ttl pk-yrs)    Types: Cigarettes   Smokeless tobacco: Never  Vaping Use   Vaping status: Never Used  Substance and Sexual Activity   Alcohol use: No   Drug use: No   Sexual activity: Yes    Tobacco Counseling Ready to quit: Not Answered Counseling given: Not Answered   Immunizations and Health Maintenance Immunization History  Administered Date(s) Administered  Fluad Quad(high Dose 65+) 01/08/2019, 02/12/2020, 05/03/2021, 02/01/2022   Influenza, High Dose Seasonal PF 02/28/2018   Influenza, Seasonal, Injecte, Preservative Fre 02/07/2017   Influenza-Unspecified 02/07/2017   Moderna Sars-Covid-2 Vaccination 12/05/2019, 01/04/2020, 06/04/2020   Pneumococcal Polysaccharide-23 05/09/2007   Pneumococcal-Unspecified 05/09/2007   Tdap 01/08/2019   Health Maintenance Due  Topic Date Due   HIV Screening  Never done   Hepatitis C Screening  Never done   Zoster Vaccines- Shingrix (1 of 2) Never done   Pneumonia Vaccine 62+ Years old (2 of 2 - PCV) 05/08/2008   INFLUENZA VACCINE  12/07/2022   COVID-19 Vaccine  (4 - 2023-24 season) 01/07/2023    Activities of Daily Living    01/07/2023   10:33 AM 12/01/2022   11:22 AM  In your present state of health, do you have any difficulty performing the following activities:  Hearing? 0 0  Vision? 0 0  Difficulty concentrating or making decisions? 0 0  Walking or climbing stairs? 0 0  Dressing or bathing? 0 0  Doing errands, shopping? 0 0  Preparing Food and eating ? N N  Using the Toilet? N N  In the past six months, have you accidently leaked urine? N N  Do you have problems with loss of bowel control? N N  Managing your Medications? N N  Managing your Finances? N N  Housekeeping or managing your Housekeeping? N N    Advanced Directives: Packet given      Assessment:    This is a routine wellness  examination for this patient .    Vision/Hearing screen No results found.   Goals   None      Depression Screen    01/05/2022    3:12 PM 07/21/2021   11:10 AM 05/03/2021    4:03 PM 02/12/2020    1:37 PM  PHQ 2/9 Scores  PHQ - 2 Score 1 2 2 1   PHQ- 9 Score 6 7 10       Fall Risk    01/07/2023   10:33 AM  Fall Risk   Falls in the past year? 0    Cognitive Function        01/11/2023    1:41 PM  6CIT Screen  What Year? 0 points  What month? 0 points  What time? 0 points  Count back from 20 0 points  Months in reverse 0 points  Repeat phrase 0 points  Total Score 0 points    Patient Care Team: Everrett Coombe, DO as PCP - General (Family Medicine)     Plan:  UTD on colon cancer screening Orders Placed This Encounter  Procedures   Pneumococcal conjugate vaccine 20-valent (Prevnar 20)   Flu Vaccine QUAD High Dose(Fluad)   Repeat AWV in 1 year  I have personally reviewed and noted the following in the patient's chart:   Medical and social history Use of alcohol, tobacco or illicit drugs  Current medications and supplements Functional ability and status Nutritional status Physical activity Advanced  directives List of other physicians Hospitalizations, surgeries, and ER visits in previous 12 months Vitals Screenings to include cognitive, depression, and falls Referrals and appointments  In addition, I have reviewed and discussed with patient certain preventive protocols, quality metrics, and best practice recommendations. A written personalized care plan for preventive services as well as general preventive health recommendations were provided to patient.     Everrett Coombe, DO 01/11/2023

## 2023-02-19 ENCOUNTER — Other Ambulatory Visit: Payer: Self-pay | Admitting: Family Medicine

## 2023-02-19 DIAGNOSIS — G4700 Insomnia, unspecified: Secondary | ICD-10-CM

## 2023-04-23 DIAGNOSIS — J439 Emphysema, unspecified: Secondary | ICD-10-CM | POA: Diagnosis not present

## 2023-04-23 DIAGNOSIS — Z91018 Allergy to other foods: Secondary | ICD-10-CM | POA: Diagnosis not present

## 2023-04-23 DIAGNOSIS — F17210 Nicotine dependence, cigarettes, uncomplicated: Secondary | ICD-10-CM | POA: Diagnosis not present

## 2023-04-23 DIAGNOSIS — R339 Retention of urine, unspecified: Secondary | ICD-10-CM | POA: Diagnosis not present

## 2023-04-23 DIAGNOSIS — Z883 Allergy status to other anti-infective agents status: Secondary | ICD-10-CM | POA: Diagnosis not present

## 2023-04-23 DIAGNOSIS — Z885 Allergy status to narcotic agent status: Secondary | ICD-10-CM | POA: Diagnosis not present

## 2023-04-23 DIAGNOSIS — F32A Depression, unspecified: Secondary | ICD-10-CM | POA: Diagnosis not present

## 2023-04-23 DIAGNOSIS — Z79899 Other long term (current) drug therapy: Secondary | ICD-10-CM | POA: Diagnosis not present

## 2023-04-23 DIAGNOSIS — Z888 Allergy status to other drugs, medicaments and biological substances status: Secondary | ICD-10-CM | POA: Diagnosis not present

## 2023-05-10 ENCOUNTER — Other Ambulatory Visit: Payer: Self-pay | Admitting: Family Medicine

## 2023-05-10 DIAGNOSIS — G4700 Insomnia, unspecified: Secondary | ICD-10-CM

## 2023-05-14 DIAGNOSIS — Z466 Encounter for fitting and adjustment of urinary device: Secondary | ICD-10-CM | POA: Diagnosis not present

## 2023-05-14 DIAGNOSIS — R339 Retention of urine, unspecified: Secondary | ICD-10-CM | POA: Diagnosis not present

## 2023-06-07 ENCOUNTER — Other Ambulatory Visit: Payer: Self-pay | Admitting: Family Medicine

## 2023-06-14 DIAGNOSIS — R339 Retention of urine, unspecified: Secondary | ICD-10-CM | POA: Diagnosis not present

## 2023-06-20 ENCOUNTER — Other Ambulatory Visit: Payer: Self-pay | Admitting: Family Medicine

## 2023-06-20 DIAGNOSIS — G4700 Insomnia, unspecified: Secondary | ICD-10-CM

## 2023-07-16 ENCOUNTER — Other Ambulatory Visit: Payer: Self-pay | Admitting: Family Medicine

## 2023-07-16 DIAGNOSIS — G4700 Insomnia, unspecified: Secondary | ICD-10-CM

## 2023-07-25 ENCOUNTER — Telehealth: Payer: Self-pay

## 2023-07-25 ENCOUNTER — Other Ambulatory Visit: Payer: Self-pay | Admitting: Family Medicine

## 2023-07-25 DIAGNOSIS — F17210 Nicotine dependence, cigarettes, uncomplicated: Secondary | ICD-10-CM

## 2023-07-25 NOTE — Telephone Encounter (Signed)
 Copied from CRM [id number]. Topic: Clinical - Request for Lab/Test Order >> Jul 25, 2023 12:27 PM Dylan Potter wrote: Reason for CRM: Pt wants to have his CT of lung scan ordered so he can have it the same day as his upcoming appt 08/16/2023. Please advise

## 2023-08-16 ENCOUNTER — Ambulatory Visit (INDEPENDENT_AMBULATORY_CARE_PROVIDER_SITE_OTHER): Admitting: Family Medicine

## 2023-08-16 ENCOUNTER — Encounter: Payer: Self-pay | Admitting: Family Medicine

## 2023-08-16 ENCOUNTER — Ambulatory Visit

## 2023-08-16 VITALS — BP 128/73 | HR 66 | Ht 70.0 in | Wt 126.0 lb

## 2023-08-16 DIAGNOSIS — G4700 Insomnia, unspecified: Secondary | ICD-10-CM

## 2023-08-16 DIAGNOSIS — R5383 Other fatigue: Secondary | ICD-10-CM | POA: Diagnosis not present

## 2023-08-16 DIAGNOSIS — F17210 Nicotine dependence, cigarettes, uncomplicated: Secondary | ICD-10-CM | POA: Diagnosis not present

## 2023-08-16 DIAGNOSIS — R790 Abnormal level of blood mineral: Secondary | ICD-10-CM | POA: Diagnosis not present

## 2023-08-16 DIAGNOSIS — R634 Abnormal weight loss: Secondary | ICD-10-CM | POA: Diagnosis not present

## 2023-08-16 DIAGNOSIS — Z122 Encounter for screening for malignant neoplasm of respiratory organs: Secondary | ICD-10-CM

## 2023-08-16 MED ORDER — ALPRAZOLAM 1 MG PO TABS: 1.5000 mg | ORAL_TABLET | Freq: Every evening | ORAL | 2 refills | Status: DC | PRN

## 2023-08-16 NOTE — Progress Notes (Signed)
 Dylan Potter - 67 y.o. male MRN [id number]  Date of birth: [birth year]  Subjective Chief Complaint  Patient presents with   Medication Refill   Weight Loss    HPI Dylan Potter is a 68 y.o. male here today for follow up visit.   He has had some mild weight loss over the past 6 months.  Denies significant changes to diet and activity level.  He has felt less energetic, feels "run down".   Denies increased dyspnea.  He does continue to smoke.  Has lung cancer screening later today.    Using combination of trazodone and seroquel for sleep.  Uses alprazolam as well as needed.  This combination is effective and he has not experience any side effects from this.    ROS:  A comprehensive ROS was completed and negative except as noted per HPI  Allergies  Allergen Reactions   Levofloxacin In D5w Rash    Burning rash in genital area    Temazepam Other (See Comments)    No help with sleep   Zolpidem Other (See Comments)   Ambien [Zolpidem Tartrate]     Sleepwalking   Aspartame And Phenylalanine Nausea Only   Codeine Rash and Itching    Past Medical History:  Diagnosis Date   BCC (basal cell carcinoma of skin)    Cardiomyopathy (HCC)    History of anxiety    Kidney stone     Past Surgical History:  Procedure Laterality Date   DEBRIDEMENT TENNIS ELBOW  2001, 2002   HERNIA REPAIR  05/08/1998   SKIN SURGERY     BCC-Elbow and Face    Social History   Socioeconomic History   Marital status: Married    Spouse name: Not on file   Number of children: Not on file   Years of education: Not on file   Highest education level: Associate degree: occupational, Scientist, product/process development, or vocational program  Occupational History   Not on file  Tobacco Use   Smoking status: Some Days    Current packs/day: 0.50    Average packs/day: 0.5 packs/day for 40.0 years (20.0 ttl pk-yrs)    Types: Cigarettes   Smokeless tobacco: Never  Vaping Use   Vaping status: Never Used  Substance and Sexual Activity    Alcohol use: No   Drug use: No   Sexual activity: Yes  Other Topics Concern   Not on file  Social History Narrative   Not on file   Social Drivers of Health   Financial Resource Strain: Low Risk  (08/12/2023)   Overall Financial Resource Strain (CARDIA)    Difficulty of Paying Living Expenses: Not very hard  Food Insecurity: No Food Insecurity (08/12/2023)   Hunger Vital Sign    Worried About Running Out of Food in the Last Year: Never true    Ran Out of Food in the Last Year: Never true  Transportation Needs: No Transportation Needs (08/12/2023)   PRAPARE - Administrator, Civil Service (Medical): No    Lack of Transportation (Non-Medical): No  Physical Activity: Insufficiently Active (08/12/2023)   Exercise Vital Sign    Days of Exercise per Week: 3 days    Minutes of Exercise per Session: 30 min  Stress: Stress Concern Present (08/12/2023)   [employer] of Occupational Health - Occupational Stress Questionnaire    Feeling of Stress : To some extent  Social Connections: Moderately Isolated (08/12/2023)   Social Connection and Isolation Panel [NHANES]    Frequency of Communication  with Friends and Family: More than three times a week    Frequency of Social Gatherings with Friends and Family: Once a week    Attends Religious Services: Never    Database administrator or Organizations: No    Attends Engineer, structural: Not on file    Marital Status: Married    Family History  Problem Relation Age of Onset   Stroke Mother    Diabetes Mother    Hyperlipidemia Mother    Heart failure Father    Kidney failure Father     Health Maintenance  Topic Date Due   Zoster Vaccines- Shingrix (1 of 2) Never done   COVID-19 Vaccine (4 - 2024-25 season) 01/07/2023   Hepatitis C Screening  01/11/2024 (Originally 09/16/1973)   HIV Screening  01/11/2024 (Originally [birth year]15/1972)   Lung Cancer Screening  10/04/2023   INFLUENZA VACCINE  12/07/2023   Medicare Annual Wellness  (AWV)  01/11/2024   DTaP/Tdap/Td (2 - Td or Tdap) 01/07/2029   Colonoscopy  09/17/2032   Pneumonia Vaccine 71+ Years old  Completed   HPV VACCINES  Aged Out   Meningococcal B Vaccine  Aged Out     ----------------------------------------------------------------------------------------------------------------------------------------------------------------------------------------------------------------- Physical Exam BP 128/73 (BP Location: Left Arm, Patient Position: Sitting, Cuff Size: Small)   Pulse 66   Ht 5\' 10"  (1.778 m)   Wt 126 lb (57.2 kg)   SpO2 97%   BMI 18.08 kg/m   Physical Exam Constitutional:      Appearance: Normal appearance.  Eyes:     General: No scleral icterus. Cardiovascular:     Rate and Rhythm: Normal rate and regular rhythm.     Pulses: Normal pulses.     Heart sounds: Normal heart sounds.  Musculoskeletal:     Cervical back: Neck supple.  Neurological:     Mental Status: He is alert.  Psychiatric:        Mood and Affect: Mood normal.        Behavior: Behavior normal.     ------------------------------------------------------------------------------------------------------------------------------------------------------------------------------------------------------------------- Assessment and Plan  Insomnia He has tried multiple sleep medications in the past with either side effects or ineffectiveness.  Continue seroquel.  He may continue 1 mg of alprazolam as well.  Down from 2mg  to 1.5mg .  Goal would be to continue to wean this back.  Other fatigue Orders Placed This Encounter  Procedures   CMP14+EGFR   CBC with Differential/Platelet   B12   Iron, TIBC and Ferritin Panel   TSH + free T4     Meds ordered this encounter  Medications   ALPRAZolam (XANAX) 1 MG tablet    Sig: Take 1.5 tablets (1.5 mg total) by mouth at bedtime as needed for anxiety.    Dispense:  45 tablet    Refill:  2    No follow-ups on file.

## 2023-08-16 NOTE — Assessment & Plan Note (Signed)
 Orders Placed This Encounter  Procedures   CMP14+EGFR   CBC with Differential/Platelet   B12   Iron, TIBC and Ferritin Panel   TSH + free T4

## 2023-08-16 NOTE — Assessment & Plan Note (Addendum)
 He has tried multiple sleep medications in the past with either side effects or ineffectiveness.  Continue seroquel.  He may continue 1 mg of alprazolam as well.  Down from 2mg  to 1.5mg .  Goal would be to continue to wean this back.

## 2023-08-17 LAB — VITAMIN B12: Vitamin B-12: 351 pg/mL (ref 232–1245)

## 2023-08-17 LAB — CMP14+EGFR
ALT: 15 IU/L (ref 0–44)
AST: 16 IU/L (ref 0–40)
Albumin: 4.3 g/dL (ref 3.9–4.9)
Alkaline Phosphatase: 87 IU/L (ref 44–121)
BUN/Creatinine Ratio: 16 (ref 10–24)
BUN: 18 mg/dL (ref 8–27)
Bilirubin Total: 0.3 mg/dL (ref 0.0–1.2)
CO2: 23 mmol/L (ref 20–29)
Calcium: 9.5 mg/dL (ref 8.6–10.2)
Chloride: 102 mmol/L (ref 96–106)
Creatinine, Ser: 1.14 mg/dL (ref 0.76–1.27)
Globulin, Total: 2.4 g/dL (ref 1.5–4.5)
Glucose: 79 mg/dL (ref 70–99)
Potassium: 4.9 mmol/L (ref 3.5–5.2)
Sodium: 139 mmol/L (ref 134–144)
Total Protein: 6.7 g/dL (ref 6.0–8.5)
eGFR: 71 mL/min/{1.73_m2} (ref >59)

## 2023-08-17 LAB — TSH+FREE T4
Free T4: 1.1 ng/dL (ref 0.82–1.77)
TSH: 2.39 u[IU]/mL (ref 0.450–4.500)

## 2023-08-17 LAB — IRON,TIBC AND FERRITIN PANEL
Ferritin: 144 ng/mL (ref 30–400)
Iron Saturation: 11 % — ABNORMAL LOW (ref 15–55)
Iron: 34 ug/dL — ABNORMAL LOW (ref 38–169)
Total Iron Binding Capacity: 318 ug/dL (ref 250–450)
UIBC: 284 ug/dL (ref 111–343)

## 2023-08-17 LAB — CBC WITH DIFFERENTIAL/PLATELET
Basophils Absolute: 0 10*3/uL (ref 0.0–0.2)
Basos: 0 %
EOS (ABSOLUTE): 0 10*3/uL (ref 0.0–0.4)
Eos: 1 %
Hematocrit: 48 % (ref 37.5–51.0)
Hemoglobin: 16.5 g/dL (ref 13.0–17.7)
Immature Grans (Abs): 0 10*3/uL (ref 0.0–0.1)
Immature Granulocytes: 0 %
Lymphocytes Absolute: 1.9 10*3/uL (ref 0.7–3.1)
Lymphs: 26 %
MCH: 31.7 pg (ref 26.6–33.0)
MCHC: 34.4 g/dL (ref 31.5–35.7)
MCV: 92 fL (ref 79–97)
Monocytes Absolute: 0.5 10*3/uL (ref 0.1–0.9)
Monocytes: 7 %
Neutrophils Absolute: 4.6 10*3/uL (ref 1.4–7.0)
Neutrophils: 66 %
Platelets: 185 10*3/uL (ref 150–450)
RBC: 5.21 x10E6/uL (ref 4.14–5.80)
RDW: 13.6 % (ref 11.6–15.4)
WBC: 7.1 10*3/uL (ref 3.4–10.8)

## 2023-08-22 ENCOUNTER — Encounter: Payer: Self-pay | Admitting: Family Medicine

## 2023-09-25 ENCOUNTER — Ambulatory Visit: Payer: Self-pay | Admitting: Family Medicine

## 2023-10-16 DIAGNOSIS — N401 Enlarged prostate with lower urinary tract symptoms: Secondary | ICD-10-CM | POA: Diagnosis not present

## 2023-10-16 DIAGNOSIS — Z87898 Personal history of other specified conditions: Secondary | ICD-10-CM | POA: Diagnosis not present

## 2023-10-16 DIAGNOSIS — R3911 Hesitancy of micturition: Secondary | ICD-10-CM | POA: Diagnosis not present

## 2023-10-16 DIAGNOSIS — R3912 Poor urinary stream: Secondary | ICD-10-CM | POA: Diagnosis not present

## 2023-10-31 ENCOUNTER — Encounter: Payer: Self-pay | Admitting: Family Medicine

## 2023-10-31 ENCOUNTER — Other Ambulatory Visit: Payer: Self-pay | Admitting: Family Medicine

## 2023-10-31 DIAGNOSIS — G4700 Insomnia, unspecified: Secondary | ICD-10-CM

## 2023-10-31 DIAGNOSIS — I25118 Atherosclerotic heart disease of native coronary artery with other forms of angina pectoris: Secondary | ICD-10-CM

## 2023-11-02 DIAGNOSIS — D239 Other benign neoplasm of skin, unspecified: Secondary | ICD-10-CM | POA: Diagnosis not present

## 2023-11-02 DIAGNOSIS — Z85828 Personal history of other malignant neoplasm of skin: Secondary | ICD-10-CM | POA: Diagnosis not present

## 2023-11-02 DIAGNOSIS — C44319 Basal cell carcinoma of skin of other parts of face: Secondary | ICD-10-CM | POA: Diagnosis not present

## 2023-11-02 DIAGNOSIS — L570 Actinic keratosis: Secondary | ICD-10-CM | POA: Diagnosis not present

## 2023-11-02 DIAGNOSIS — D492 Neoplasm of unspecified behavior of bone, soft tissue, and skin: Secondary | ICD-10-CM | POA: Diagnosis not present

## 2023-11-08 DIAGNOSIS — R918 Other nonspecific abnormal finding of lung field: Secondary | ICD-10-CM | POA: Diagnosis not present

## 2023-11-08 DIAGNOSIS — Z881 Allergy status to other antibiotic agents status: Secondary | ICD-10-CM | POA: Diagnosis not present

## 2023-11-08 DIAGNOSIS — F17200 Nicotine dependence, unspecified, uncomplicated: Secondary | ICD-10-CM | POA: Diagnosis not present

## 2023-11-08 DIAGNOSIS — I498 Other specified cardiac arrhythmias: Secondary | ICD-10-CM | POA: Diagnosis not present

## 2023-11-08 DIAGNOSIS — R0602 Shortness of breath: Secondary | ICD-10-CM | POA: Diagnosis not present

## 2023-11-08 DIAGNOSIS — J449 Chronic obstructive pulmonary disease, unspecified: Secondary | ICD-10-CM | POA: Diagnosis not present

## 2023-11-08 DIAGNOSIS — Z885 Allergy status to narcotic agent status: Secondary | ICD-10-CM | POA: Diagnosis not present

## 2023-11-08 DIAGNOSIS — R079 Chest pain, unspecified: Secondary | ICD-10-CM | POA: Diagnosis not present

## 2023-11-08 DIAGNOSIS — R7989 Other specified abnormal findings of blood chemistry: Secondary | ICD-10-CM | POA: Diagnosis not present

## 2023-11-13 DIAGNOSIS — C44319 Basal cell carcinoma of skin of other parts of face: Secondary | ICD-10-CM | POA: Diagnosis not present

## 2023-11-28 DIAGNOSIS — R072 Precordial pain: Secondary | ICD-10-CM | POA: Diagnosis not present

## 2023-11-28 DIAGNOSIS — R0609 Other forms of dyspnea: Secondary | ICD-10-CM | POA: Diagnosis not present

## 2023-11-28 DIAGNOSIS — R42 Dizziness and giddiness: Secondary | ICD-10-CM | POA: Diagnosis not present

## 2023-11-28 DIAGNOSIS — R002 Palpitations: Secondary | ICD-10-CM | POA: Diagnosis not present

## 2023-12-12 DIAGNOSIS — R072 Precordial pain: Secondary | ICD-10-CM | POA: Diagnosis not present

## 2023-12-12 DIAGNOSIS — R42 Dizziness and giddiness: Secondary | ICD-10-CM | POA: Diagnosis not present

## 2023-12-12 DIAGNOSIS — R0609 Other forms of dyspnea: Secondary | ICD-10-CM | POA: Diagnosis not present

## 2023-12-12 DIAGNOSIS — R002 Palpitations: Secondary | ICD-10-CM | POA: Diagnosis not present

## 2023-12-14 DIAGNOSIS — R072 Precordial pain: Secondary | ICD-10-CM | POA: Diagnosis not present

## 2024-01-09 DIAGNOSIS — R002 Palpitations: Secondary | ICD-10-CM | POA: Diagnosis not present

## 2024-01-09 DIAGNOSIS — R0609 Other forms of dyspnea: Secondary | ICD-10-CM | POA: Diagnosis not present

## 2024-01-09 DIAGNOSIS — R42 Dizziness and giddiness: Secondary | ICD-10-CM | POA: Diagnosis not present

## 2024-01-09 DIAGNOSIS — R072 Precordial pain: Secondary | ICD-10-CM | POA: Diagnosis not present

## 2024-01-22 ENCOUNTER — Ambulatory Visit (INDEPENDENT_AMBULATORY_CARE_PROVIDER_SITE_OTHER)

## 2024-01-22 ENCOUNTER — Other Ambulatory Visit: Payer: Self-pay

## 2024-01-22 VITALS — BP 130/79 | HR 63 | Ht 70.0 in | Wt 123.0 lb

## 2024-01-22 DIAGNOSIS — Z23 Encounter for immunization: Secondary | ICD-10-CM | POA: Diagnosis not present

## 2024-01-22 DIAGNOSIS — G4700 Insomnia, unspecified: Secondary | ICD-10-CM

## 2024-01-22 DIAGNOSIS — Z0000 Encounter for general adult medical examination without abnormal findings: Secondary | ICD-10-CM | POA: Diagnosis not present

## 2024-01-22 NOTE — Patient Instructions (Signed)
  Dylan Potter , Thank you for taking time to come for your Medicare Wellness Visit. I appreciate your ongoing commitment to your health goals. Please review the following plan we discussed and let me know if I can assist you in the future.   These are the goals we discussed:  Goals      Patient Stated     Patient states he would like to quit smoking.         This is a list of the screening recommended for you and due dates:  Health Maintenance  Topic Date Due   Hepatitis C Screening  Never done   Zoster (Shingles) Vaccine (1 of 2) Never done   Flu Shot  12/07/2023   COVID-19 Vaccine (4 - 2025-26 season) 01/07/2024   Screening for Lung Cancer  08/15/2024   Medicare Annual Wellness Visit  01/21/2025   DTaP/Tdap/Td vaccine (2 - Td or Tdap) 01/07/2029   Colon Cancer Screening  09/17/2032   Pneumococcal Vaccine for age over 79  Completed   HPV Vaccine  Aged Out   Meningitis B Vaccine  Aged Out

## 2024-01-22 NOTE — Progress Notes (Signed)
 Subjective:   Dylan Potter is a 68 y.o. male who presents for Medicare Annual/Subsequent preventive examination.  Visit Complete: In person  Patient Medicare AWV questionnaire was completed by the patient on 01/18/2024; I have confirmed that all information answered by patient is correct and no changes since this date.  Cardiac Risk Factors include: advanced age (>70men, >87 women);male gender;smoking/ tobacco exposure;family history of premature cardiovascular disease     Objective:    Today's Vitals   01/22/24 1304  BP: 130/79  Pulse: 63  SpO2: 98%  Weight: 123 lb (55.8 kg)  Height: 5' 10 (1.778 m)   Body mass index is 17.65 kg/m.     01/22/2024    1:15 PM  Advanced Directives  Does Patient Have a Medical Advance Directive? No  Would patient like information on creating a medical advance directive? No - Patient declined    Current Medications (verified) Outpatient Encounter Medications as of 01/22/2024  Medication Sig   ALPRAZolam  (XANAX ) 1 MG tablet TAKE 1 AND 1/2 TABLETS AT BEDTIME AS NEEDED FOR ANXIETY   pantoprazole (PROTONIX) 40 MG tablet Take 40 mg by mouth at bedtime.   QUEtiapine  (SEROQUEL ) 50 MG tablet TAKE 1 TO 2 TABLETS AT BEDTIME   tamsulosin  (FLOMAX ) 0.4 MG CAPS capsule Take 0.4 mg by mouth daily.   traZODone  (DESYREL ) 150 MG tablet TAKE 1 TABLET AT BEDTIME   No facility-administered encounter medications on file as of 01/22/2024.    Allergies (verified) Levofloxacin  in d5w, Temazepam , Zolpidem , Ambien  [zolpidem  tartrate], Aspartame and phenylalanine, and Codeine   History: Past Medical History:  Diagnosis Date   BCC (basal cell carcinoma of skin)    Cardiomyopathy (HCC)    History of anxiety    Kidney stone    Past Surgical History:  Procedure Laterality Date   DEBRIDEMENT TENNIS ELBOW  2001, 2002   HERNIA REPAIR  05/08/1998   SKIN SURGERY     BCC-Elbow and Face   Family History  Problem Relation Age of Onset   Stroke Mother    Diabetes  Mother    Hyperlipidemia Mother    Heart failure Father    Kidney failure Father    Social History   Socioeconomic History   Marital status: Married    Spouse name: Not on file   Number of children: Not on file   Years of education: Not on file   Highest education level: 12th grade  Occupational History   Not on file  Tobacco Use   Smoking status: Some Days    Current packs/day: 0.50    Average packs/day: 0.5 packs/day for 51.3 years (25.6 ttl pk-yrs)    Types: Cigarettes    Start date: 10/07/1972   Smokeless tobacco: Never  Vaping Use   Vaping status: Never Used  Substance and Sexual Activity   Alcohol use: No   Drug use: No   Sexual activity: Yes  Other Topics Concern   Not on file  Social History Narrative   Dylan Potter lives with his wife. He enjoys playing games on the computer and taking care of things around the house.    Social Drivers of Corporate investment banker Strain: Low Risk  (01/22/2024)   Overall Financial Resource Strain (CARDIA)    Difficulty of Paying Living Expenses: Not hard at all  Food Insecurity: No Food Insecurity (01/22/2024)   Hunger Vital Sign    Worried About Running Out of Food in the Last Year: Never true    Ran Out of  Food in the Last Year: Never true  Transportation Needs: No Transportation Needs (01/22/2024)   PRAPARE - Administrator, Civil Service (Medical): No    Lack of Transportation (Non-Medical): No  Physical Activity: Sufficiently Active (01/22/2024)   Exercise Vital Sign    Days of Exercise per Week: 7 days    Minutes of Exercise per Session: 60 min  Recent Concern: Physical Activity - Insufficiently Active (01/18/2024)   Exercise Vital Sign    Days of Exercise per Week: 3 days    Minutes of Exercise per Session: 20 min  Stress: No Stress Concern Present (01/22/2024)   [employer] of Occupational Health - Occupational Stress Questionnaire    Feeling of Stress: Not at all  Social Connections: Moderately Isolated  (01/22/2024)   Social Connection and Isolation Panel    Frequency of Communication with Friends and Family: More than three times a week    Frequency of Social Gatherings with Friends and Family: Once a week    Attends Religious Services: Never    Database administrator or Organizations: No    Attends Engineer, structural: Never    Marital Status: Married    Tobacco Counseling Ready to quit: Not Answered Counseling given: Not Answered   Clinical Intake:  Pre-visit preparation completed: Yes  Pain : No/denies pain     BMI - recorded: 17.65 Nutritional Status: BMI <19  Underweight Nutritional Risks: None Diabetes: No  How often do you need to have someone help you when you read instructions, pamphlets, or other written materials from your doctor or pharmacy?: 1 - Never What is the last grade level you completed in school?: 14  Interpreter Needed?: No      Activities of Daily Living    01/22/2024    1:06 PM 01/18/2024   12:32 PM  In your present state of health, do you have any difficulty performing the following activities:  Hearing? 0 0  Vision? 0 0  Difficulty concentrating or making decisions? 0 0  Walking or climbing stairs? 0 0  Dressing or bathing? 0 0  Doing errands, shopping? 0 0  Preparing Food and eating ? N N  Using the Toilet? N N  In the past six months, have you accidently leaked urine? N N  Do you have problems with loss of bowel control? N N  Managing your Medications? N N  Managing your Finances? N N  Housekeeping or managing your Housekeeping? N N    Patient Care Team: Alvia Bring, DO as PCP - General (Family Medicine) [place] Darryle SAUNDERS, MD as Referring Physician (Dermatology) Saturnino Marzetta Blumenthal, MD as Referring Physician (Urology) Noureddine, Nizar D, MD as Referring Physician (Cardiology)  Indicate any recent Medical Services you may have received from other than [facility] providers in the past year (date may be  approximate).     Assessment:   This is a routine wellness examination for Dylan Potter.  Hearing/Vision screen No results found.   Goals Addressed             This Visit's Progress    Patient Stated       Patient states he would like to quit smoking.        Depression Screen    01/22/2024    1:13 PM 01/11/2023    2:01 PM 01/05/2022    3:12 PM 07/21/2021   11:10 AM 05/03/2021    4:03 PM 02/12/2020    1:37 PM 01/08/2019    1:58  PM  PHQ 2/9 Scores  PHQ - 2 Score 0 0 1 2 2 1 4   PHQ- 9 Score   6 7 10  9     Fall Risk    01/22/2024    1:15 PM 01/18/2024   12:32 PM 01/07/2023   10:33 AM 12/01/2022   11:22 AM 06/08/2022    3:11 PM  Fall Risk   Falls in the past year? 0 0 0 0 0  Number falls in past yr: 0    0  Injury with Fall? 0 0   0  Risk for fall due to : No Fall Risks    No Fall Risks  Follow up Falls evaluation completed    Falls evaluation completed    MEDICARE RISK AT HOME: Medicare Risk at Home Any stairs in or around the home?: No If so, are there any without handrails?: No Home free of loose throw rugs in walkways, pet beds, electrical cords, etc?: Yes Adequate lighting in your home to reduce risk of falls?: Yes Life alert?: No Use of a cane, walker or w/c?: No Grab bars in the bathroom?: No Shower chair or bench in shower?: Yes Elevated toilet seat or a handicapped toilet?: No  TIMED UP AND GO:  Was the test performed?  Yes  Length of time to ambulate 10 feet: 7 sec Gait steady and fast without use of assistive device    Cognitive Function:        01/22/2024    1:15 PM 01/11/2023    1:41 PM  6CIT Screen  What Year? 0 points 0 points  What month? 0 points 0 points  What time? 0 points 0 points  Count back from 20 0 points 0 points  Months in reverse 2 points 0 points  Repeat phrase 0 points 0 points  Total Score 2 points 0 points    Immunizations Immunization History  Administered Date(s) Administered   Fluad Quad(high Dose 65+) 01/08/2019,  02/12/2020, 05/03/2021, 02/01/2022, 01/11/2023   INFLUENZA, HIGH DOSE SEASONAL PF 02/28/2018, 01/22/2024   Influenza, Seasonal, Injecte, Preservative Fre 02/07/2017   Influenza-Unspecified 02/07/2017   Moderna Sars-Covid-2 Vaccination 12/05/2019, 01/04/2020, 06/04/2020   PNEUMOCOCCAL CONJUGATE-20 01/11/2023   Pneumococcal Polysaccharide-23 05/09/2007   Pneumococcal-Unspecified 05/09/2007   Tdap 01/08/2019    TDAP status: Up to date  Flu Vaccine status: Completed at today's visit  Pneumococcal vaccine status: Up to date  Covid-19 vaccine status: Declined, Education has been provided regarding the importance of this vaccine but patient still declined. Advised may receive this vaccine at local pharmacy or Health Dept.or vaccine clinic. Aware to provide a copy of the vaccination record if obtained from local pharmacy or Health Dept. Verbalized acceptance and understanding.  Qualifies for Shingles Vaccine? Yes   Zostavax completed No   Shingrix Completed?: No.    Education has been provided regarding the importance of this vaccine. Patient has been advised to call insurance company to determine out of pocket expense if they have not yet received this vaccine. Advised may also receive vaccine at local pharmacy or Health Dept. Verbalized acceptance and understanding.  Screening Tests Health Maintenance  Topic Date Due   Hepatitis C Screening  Never done   Zoster Vaccines- Shingrix (1 of 2) Never done   COVID-19 Vaccine (4 - 2025-26 season) 01/07/2024   Lung Cancer Screening  08/15/2024   Medicare Annual Wellness (AWV)  01/21/2025   DTaP/Tdap/Td (2 - Td or Tdap) 01/07/2029   Colonoscopy  09/17/2032   Pneumococcal Vaccine:  50+ Years  Completed   Influenza Vaccine  Completed   HPV VACCINES  Aged Out   Meningococcal B Vaccine  Aged Out    Health Maintenance  Health Maintenance Due  Topic Date Due   Hepatitis C Screening  Never done   Zoster Vaccines- Shingrix (1 of 2) Never done    COVID-19 Vaccine (4 - 2025-26 season) 01/07/2024    Colorectal cancer screening: Type of screening: Colonoscopy. Completed 09/18/2022. Repeat every 10 years  Lung Cancer Screening: (Low Dose CT Chest recommended if Age 53-80 years, 20 pack-year currently smoking OR have quit w/in 15years.) does qualify.   Lung Cancer Screening Referral: Patient already in program.   Additional Screening:  Hepatitis C Screening: does qualify; Completed not yet  Vision Screening: Recommended annual ophthalmology exams for early detection of glaucoma and other disorders of the eye. Is the patient up to date with their annual eye exam?  Yes  Who is the provider or what is the name of the office in which the patient attends annual eye exams? [facility]  If pt is not established with a provider, would they like to be referred to a provider to establish care? No .   Dental Screening: Recommended annual dental exams for proper oral hygiene   Community Resource Referral / Chronic Care Management: CRR required this visit?  No   CCM required this visit?  No     Plan:     I have personally reviewed and noted the following in the patient's chart:   Medical and social history Use of alcohol, tobacco or illicit drugs  Current medications and supplements including opioid prescriptions. Patient is not currently taking opioid prescriptions. Functional ability and status Nutritional status Physical activity Advanced directives List of other physicians Hospitalizations # 0, surgeries # 0 , and ER # 2 visits in previous 12 months Vitals Screenings to include cognitive, depression, and falls Referrals and appointments  In addition, I have reviewed and discussed with patient certain preventive protocols, quality metrics, and best practice recommendations. A written personalized care plan for preventive services as well as general preventive health recommendations were provided to patient.     Bonny Jon Mayor, CMA   01/22/2024   After Visit Summary: (In Person-Printed) AVS printed and given to the patient  Nurse Notes:    Dylan Potter is a 68 y.o. male patient of Velma Ku, DO who had a Medicare Annual Wellness Visit today. He reports that he is socially active and does interact with friends/family regularly. He is moderately physically active. He enjoys doing things around the house.

## 2024-01-22 NOTE — Telephone Encounter (Signed)
 Last fill 10/31/23 last visit 08/16/23 next visit 02/20/24

## 2024-01-28 MED ORDER — ALPRAZOLAM 1 MG PO TABS: 1.5000 mg | ORAL_TABLET | Freq: Every day | ORAL | 3 refills | Status: DC

## 2024-01-29 DIAGNOSIS — R0602 Shortness of breath: Secondary | ICD-10-CM | POA: Diagnosis not present

## 2024-01-29 DIAGNOSIS — R079 Chest pain, unspecified: Secondary | ICD-10-CM | POA: Diagnosis not present

## 2024-01-31 ENCOUNTER — Other Ambulatory Visit: Payer: Self-pay | Admitting: Family Medicine

## 2024-01-31 DIAGNOSIS — G4700 Insomnia, unspecified: Secondary | ICD-10-CM

## 2024-02-20 ENCOUNTER — Ambulatory Visit: Admitting: Family Medicine

## 2024-03-01 ENCOUNTER — Other Ambulatory Visit: Payer: Self-pay | Admitting: Family Medicine

## 2024-03-01 DIAGNOSIS — F411 Generalized anxiety disorder: Secondary | ICD-10-CM

## 2024-03-03 NOTE — Telephone Encounter (Signed)
 Pls contact the pt to schedule Mood appt with Dr. Alvia. Sending med refill. Thx.

## 2024-03-06 ENCOUNTER — Encounter: Payer: Self-pay | Admitting: Family Medicine

## 2024-03-06 ENCOUNTER — Ambulatory Visit (INDEPENDENT_AMBULATORY_CARE_PROVIDER_SITE_OTHER): Admitting: Family Medicine

## 2024-03-06 VITALS — BP 126/79 | HR 63 | Ht 70.0 in | Wt 125.0 lb

## 2024-03-06 DIAGNOSIS — F5104 Psychophysiologic insomnia: Secondary | ICD-10-CM | POA: Diagnosis not present

## 2024-03-06 DIAGNOSIS — F17210 Nicotine dependence, cigarettes, uncomplicated: Secondary | ICD-10-CM

## 2024-03-06 NOTE — Assessment & Plan Note (Signed)
 He has tried multiple sleep medications in the past with either side effects or ineffectiveness.  Continue seroquel.  He may continue 1 mg of alprazolam as well.  Down from 2mg  to 1.5mg .  Goal would be to continue to wean this back.

## 2024-03-06 NOTE — Progress Notes (Signed)
 Dylan Potter - 68 y.o. male MRN [id number]  Date of birth: Jun 21, 1955  Subjective Chief Complaint  Patient presents with   Mood    HPI Dylan Potter is a 68 y.o. male here today for follow up visit.   He reports that he is doing well.   He is doing well overall. He has had significant insomnia.  Currently on seroquel  and trazodone  with alprzolam as needed, which he uses most nights as well.  Current medications are working pretty well.  No side effects at current strength.  Denies over-sedation, morning sleepiness or falls.   ROS:  A comprehensive ROS was completed and negative except as noted per HPI  Allergies  Allergen Reactions   Levofloxacin  In D5w Rash    Burning rash in genital area    Temazepam  Other (See Comments)    No help with sleep   Zolpidem  Other (See Comments)   Ambien  [Zolpidem  Tartrate]     Sleepwalking   Aspartame And Phenylalanine Nausea Only   Codeine Rash and Itching    Past Medical History:  Diagnosis Date   BCC (basal cell carcinoma of skin)    Cardiomyopathy (HCC)    History of anxiety    Kidney stone     Past Surgical History:  Procedure Laterality Date   DEBRIDEMENT TENNIS ELBOW  2001, 2002   HERNIA REPAIR  05/08/1998   SKIN SURGERY     BCC-Elbow and Face    Social History   Socioeconomic History   Marital status: Married    Spouse name: Not on file   Number of children: Not on file   Years of education: Not on file   Highest education level: Associate degree: occupational, scientist, product/process development, or vocational program  Occupational History   Not on file  Tobacco Use   Smoking status: Some Days    Current packs/day: 0.50    Average packs/day: 0.5 packs/day for 51.4 years (25.7 ttl pk-yrs)    Types: Cigarettes    Start date: 10/07/1972   Smokeless tobacco: Never  Vaping Use   Vaping status: Never Used  Substance and Sexual Activity   Alcohol use: No   Drug use: No   Sexual activity: Yes  Other Topics Concern   Not on file  Social History  Narrative   Dylan Potter lives with his wife. He enjoys playing games on the computer and taking care of things around the house.    Social Drivers of Corporate Investment Banker Strain: Low Risk  (03/03/2024)   Overall Financial Resource Strain (CARDIA)    Difficulty of Paying Living Expenses: Not very hard  Food Insecurity: No Food Insecurity (03/03/2024)   Hunger Vital Sign    Worried About Running Out of Food in the Last Year: Never true    Ran Out of Food in the Last Year: Never true  Transportation Needs: No Transportation Needs (03/03/2024)   PRAPARE - Administrator, Civil Service (Medical): No    Lack of Transportation (Non-Medical): No  Physical Activity: Insufficiently Active (03/03/2024)   Exercise Vital Sign    Days of Exercise per Week: 2 days    Minutes of Exercise per Session: 20 min  Stress: No Stress Concern Present (03/03/2024)   [employer] of Occupational Health - Occupational Stress Questionnaire    Feeling of Stress: Only a little  Social Connections: Moderately Isolated (03/03/2024)   Social Connection and Isolation Panel    Frequency of Communication with Friends and Family: More than three  times a week    Frequency of Social Gatherings with Friends and Family: Once a week    Attends Religious Services: Never    Database Administrator or Organizations: No    Attends Engineer, Structural: Not on file    Marital Status: Married    Family History  Problem Relation Age of Onset   Stroke Mother    Diabetes Mother    Hyperlipidemia Mother    Heart failure Father    Kidney failure Father     Health Maintenance  Topic Date Due   Hepatitis C Screening  Never done   Zoster Vaccines- Shingrix (1 of 2) 06/06/2024 (Originally 09/15/1976)   COVID-19 Vaccine (4 - 2025-26 season) 03/22/2025 (Originally 01/07/2024)   Lung Cancer Screening  08/15/2024   Medicare Annual Wellness (AWV)  01/21/2025   DTaP/Tdap/Td (2 - Td or Tdap) 01/07/2029    Colonoscopy  09/17/2032   Pneumococcal Vaccine: 50+ Years  Completed   Influenza Vaccine  Completed   Meningococcal B Vaccine  Aged Out     ----------------------------------------------------------------------------------------------------------------------------------------------------------------------------------------------------------------- Physical Exam BP 126/79 (BP Location: Left Arm, Patient Position: Sitting, Cuff Size: Normal)   Pulse 63   Ht 5' 10 (1.778 m)   Wt 125 lb (56.7 kg)   SpO2 98%   BMI 17.94 kg/m   Physical Exam Constitutional:      Appearance: Normal appearance.  Eyes:     General: No scleral icterus. Cardiovascular:     Rate and Rhythm: Normal rate and regular rhythm.  Pulmonary:     Effort: Pulmonary effort is normal.     Breath sounds: Normal breath sounds.  Neurological:     General: No focal deficit present.     Mental Status: He is alert.  Psychiatric:        Mood and Affect: Mood normal.        Behavior: Behavior normal.     ------------------------------------------------------------------------------------------------------------------------------------------------------------------------------------------------------------------- Assessment and Plan  Insomnia He has tried multiple sleep medications in the past with either side effects or ineffectiveness.  Continue seroquel .  He may continue 1 mg of alprazolam  as well.  Down from 2mg  to 1.5mg .  Goal would be to continue to wean this back.  Cigarette nicotine dependence without complication Counseling provided on smoking cessation.    No orders of the defined types were placed in this encounter.   Return in about 6 months (around 09/04/2024) for Insomnia.

## 2024-03-06 NOTE — Assessment & Plan Note (Signed)
 Counseling provided on smoking cessation

## 2024-03-26 ENCOUNTER — Other Ambulatory Visit: Payer: Self-pay | Admitting: Family Medicine

## 2024-05-10 ENCOUNTER — Other Ambulatory Visit: Payer: Self-pay | Admitting: Family Medicine

## 2024-05-10 DIAGNOSIS — G4700 Insomnia, unspecified: Secondary | ICD-10-CM

## 2024-05-14 ENCOUNTER — Encounter: Payer: Self-pay | Admitting: Family Medicine

## 2024-05-14 DIAGNOSIS — G4700 Insomnia, unspecified: Secondary | ICD-10-CM

## 2024-05-15 MED ORDER — ALPRAZOLAM 1 MG PO TABS: 1.5000 mg | ORAL_TABLET | Freq: Every day | ORAL | 3 refills | Status: AC

## 2024-05-22 ENCOUNTER — Encounter: Payer: Self-pay | Admitting: Family Medicine

## 2024-05-29 ENCOUNTER — Ambulatory Visit

## 2024-05-29 ENCOUNTER — Encounter: Payer: Self-pay | Admitting: Family Medicine

## 2024-05-29 ENCOUNTER — Ambulatory Visit: Admitting: Family Medicine

## 2024-05-29 VITALS — BP 134/73 | HR 54 | Ht 70.0 in | Wt 127.0 lb

## 2024-05-29 DIAGNOSIS — R1011 Right upper quadrant pain: Secondary | ICD-10-CM | POA: Diagnosis not present

## 2024-05-29 DIAGNOSIS — K8020 Calculus of gallbladder without cholecystitis without obstruction: Secondary | ICD-10-CM

## 2024-05-29 NOTE — Patient Instructions (Signed)
Cholelithiasis  Cholelithiasis happens when gallstones form in the gallbladder. The gallbladder stores bile. Bile is a fluid that helps digest fats. Bile can harden and form into gallstones. If they cause a blockage, they can cause pain (gallbladder attack). What are the causes? This condition may be caused by: Too much bilirubin in the bile. This happens if you have sickle cell anemia. Too much of a fat-like substance (cholesterol) in your bile. Not enough bile salts in your bile. These salts help the body absorb and digest fats. The gallbladder not emptying fully or often enough. This is common in pregnant women. What increases the risk? The following factors may make you more likely to develop this condition: Being older than age 3. Eating a lot of fried foods, fat, and refined carbs (refined carbohydrates). Being male. Being pregnant many times. Using medicines with male hormones in them for a long time. Losing weight fast. Having gallstones in your family. Having health problems, such as diabetes, obesity, Crohn's disease, or liver disease. What are the signs or symptoms? Often, there may be gallstones but no symptoms. These gallstones are called silent gallstones. If a gallstone causes a blockage, you may get sudden pain. The pain: Can be in the upper right part of your belly (abdomen). Normally comes at night or after you eat. Can last an hour or more. Can spread to your right shoulder, back, or chest. Can feel like discomfort, burning, or fullness in the upper part of your belly (indigestion). If the blockage lasts more than a few hours, you can get an infection or swelling. You may: Vomit or feel like you may vomit (nauseous). Feel bloated. Have belly pain for 5 hours or more. Feel tender in your belly, often in the upper right part and under your ribs. Have a fever or chills. Have skin or the white parts of your eyes turn yellow (jaundice). Have dark pee (urine) or  pale poop (stool). How is this treated? Treatment for this condition depends on how bad you feel. If you have symptoms, you may need: Home care, if symptoms are not very bad. Do not eat for 12-24 hours. Drink only water and clear liquids. After 1 or 2 days, start to eat simple or clear foods. Try broth and crackers. You may need medicines for pain or stomach upset or both. If you have an infection, you will need antibiotics. A hospital stay, if you have very bad pain or a very bad infection. Surgery to remove your gallbladder. You may need this if: Gallstones keep coming back. You have very bad symptoms. Medicines to break up gallstones. Medicines may be used for 6-12 months. A procedure to find and take out gallstones or to break up gallstones. Follow these instructions at home: Medicines Take over-the-counter and prescription medicines only as told by your doctor. If you were prescribed antibiotics, take them as told by your doctor. Do not stop taking them even if you start to feel better. Ask your doctor if you should avoid driving or using machines while you are taking your medicine. Eating and drinking Drink enough fluid to keep your pee pale yellow. Drink water or clear fluids. This is important when you have pain. Eat healthy foods. Choose: Fewer fatty foods, such as fried foods. Fewer refined carbs. Avoid breads and grains that are highly processed, such as white bread and white rice. Choose whole grains, such as whole-wheat bread and brown rice. More fiber. Almonds, fresh fruit, and beans are healthy sources. General  instructions Keep a healthy weight. Keep all follow-up visits. You may need to see a specialist or a Careers adviser. Where to find more information [employer] of Diabetes and Digestive and Kidney Diseases: [email or web address] Contact a doctor if: You have sudden pain in the upper right part of your belly. Pain might spread to your right shoulder, back, or chest. Your  pain lasts more than 2 hours. You have been diagnosed with gallstones that have no symptoms and you get: Belly pain. Discomfort, burning, or fullness in the upper part of your abdomen. You keep feeling like you may vomit. You have dark pee or pale poop. Get help right away if: You have pain in your abdomen, that: Lasts more than 5 hours. Keeps getting worse. You have a fever or chills. You can't stop vomiting. Your skin or the white parts of your eyes turn yellow. This information is not intended to replace advice given to you by your health care provider. Make sure you discuss any questions you have with your health care provider. Document Revised: 02/06/2022 Document Reviewed: 02/06/2022 Elsevier Patient Education  2024 [employer].

## 2024-05-29 NOTE — Assessment & Plan Note (Signed)
 Incidentally noted on recent renal US .  Dedicated RUQ ultrasound ordered. Updated hepatic function ordered.

## 2024-05-29 NOTE — Progress Notes (Signed)
 " Bertran Zeimet - 69 y.o. male MRN [id number]  Date of birth: 1955-09-07  Subjective Chief Complaint  Patient presents with   Results    HPI Tristian Sickinger is a 69 y.o. male here today for follow up of recent imaging.  His urologist recently ordered a renal US  which showed incidental gallstones and liver lesions, potentially hemangioma.  He reports that he has had some intermittent dyspepsia and bloating after eating.  Denies nausea.    ROS:  A comprehensive ROS was completed and negative except as noted per HPI  Allergies[1]  Past Medical History:  Diagnosis Date   BCC (basal cell carcinoma of skin)    Cardiomyopathy (HCC)    History of anxiety    Kidney stone     Past Surgical History:  Procedure Laterality Date   DEBRIDEMENT TENNIS ELBOW  2001, 2002   HERNIA REPAIR  05/08/1998   SKIN SURGERY     BCC-Elbow and Face    Social History   Socioeconomic History   Marital status: Married    Spouse name: Not on file   Number of children: Not on file   Years of education: Not on file   Highest education level: Associate degree: occupational, scientist, product/process development, or vocational program  Occupational History   Not on file  Tobacco Use   Smoking status: Some Days    Current packs/day: 0.50    Average packs/day: 0.5 packs/day for 51.6 years (25.8 ttl pk-yrs)    Types: Cigarettes    Start date: 10/07/1972   Smokeless tobacco: Never  Vaping Use   Vaping status: Never Used  Substance and Sexual Activity   Alcohol use: No   Drug use: No   Sexual activity: Yes  Other Topics Concern   Not on file  Social History Narrative   Vanna lives with his wife. He enjoys playing games on the computer and taking care of things around the house.    Social Drivers of Health   Tobacco Use: High Risk (05/29/2024)   Patient History    Smoking Tobacco Use: Some Days    Smokeless Tobacco Use: Never    Passive Exposure: Not on file  Financial Resource Strain: Low Risk (03/03/2024)   Overall Financial  Resource Strain (CARDIA)    Difficulty of Paying Living Expenses: Not very hard  Food Insecurity: No Food Insecurity (03/03/2024)   Epic    Worried About Programme Researcher, Broadcasting/film/video in the Last Year: Never true    Ran Out of Food in the Last Year: Never true  Transportation Needs: No Transportation Needs (03/03/2024)   Epic    Lack of Transportation (Medical): No    Lack of Transportation (Non-Medical): No  Physical Activity: Insufficiently Active (03/03/2024)   Exercise Vital Sign    Days of Exercise per Week: 2 days    Minutes of Exercise per Session: 20 min  Stress: No Stress Concern Present (03/03/2024)   [employer] of Occupational Health - Occupational Stress Questionnaire    Feeling of Stress: Only a little  Social Connections: Moderately Isolated (03/03/2024)   Social Connection and Isolation Panel    Frequency of Communication with Friends and Family: More than three times a week    Frequency of Social Gatherings with Friends and Family: Once a week    Attends Religious Services: Never    Database Administrator or Organizations: No    Attends Banker Meetings: Not on file    Marital Status: Married  Depression (PHQ2-9): Medium  Risk (05/29/2024)   Depression (PHQ2-9)    PHQ-2 Score: 7  Alcohol Screen: Low Risk (01/22/2024)   Alcohol Screen    Last Alcohol Screening Score (AUDIT): 0  Housing: Low Risk (03/03/2024)   Epic    Unable to Pay for Housing in the Last Year: No    Number of Times Moved in the Last Year: 0    Homeless in the Last Year: No  Utilities: Not At Risk (01/22/2024)   Epic    Threatened with loss of utilities: No  Health Literacy: Adequate Health Literacy (01/22/2024)   B1300 Health Literacy    Frequency of need for help with medical instructions: Never    Family History  Problem Relation Age of Onset   Stroke Mother    Diabetes Mother    Hyperlipidemia Mother    Heart failure Father    Kidney failure Father     Health Maintenance   Topic Date Due   Hepatitis C Screening  Never done   Zoster Vaccines- Shingrix (1 of 2) 06/06/2024 (Originally 09/15/1976)   COVID-19 Vaccine (4 - 2025-26 season) 03/22/2025 (Originally 01/07/2024)   Lung Cancer Screening  08/15/2024   Medicare Annual Wellness (AWV)  01/21/2025   DTaP/Tdap/Td (2 - Td or Tdap) 01/07/2029   Colonoscopy  09/17/2032   Pneumococcal Vaccine: 50+ Years  Completed   Influenza Vaccine  Completed   Meningococcal B Vaccine  Aged Out     ----------------------------------------------------------------------------------------------------------------------------------------------------------------------------------------------------------------- Physical Exam BP 134/73 (BP Location: Left Arm, Patient Position: Sitting, Cuff Size: Normal)   Pulse (!) 54   Ht 5' 10 (1.778 m)   Wt 127 lb (57.6 kg)   SpO2 96%   BMI 18.22 kg/m   Physical Exam Constitutional:      Appearance: Normal appearance.  HENT:     Head: Normocephalic and atraumatic.  Eyes:     General: No scleral icterus. Cardiovascular:     Rate and Rhythm: Normal rate and regular rhythm.  Pulmonary:     Effort: Pulmonary effort is normal.     Breath sounds: Normal breath sounds.  Abdominal:     General: There is no distension.     Palpations: Abdomen is soft.     Tenderness: There is no abdominal tenderness.  Musculoskeletal:     Cervical back: Neck supple.  Neurological:     Mental Status: He is alert.  Psychiatric:        Mood and Affect: Mood normal.     ------------------------------------------------------------------------------------------------------------------------------------------------------------------------------------------------------------------- Assessment and Plan  Calculus of gallbladder without cholecystitis without obstruction Incidentally noted on recent renal US .  Dedicated RUQ ultrasound ordered. Updated hepatic function ordered.    No orders of the defined  types were placed in this encounter.   No follow-ups on file.        [1]  Allergies Allergen Reactions   Levofloxacin  In D5w Rash    Burning rash in genital area    Temazepam  Other (See Comments)    No help with sleep   Zolpidem  Other (See Comments)   Ambien  [Zolpidem  Tartrate]     Sleepwalking   Aspartame And Phenylalanine Nausea Only   Codeine Rash and Itching   "

## 2024-05-30 ENCOUNTER — Ambulatory Visit: Payer: Self-pay | Admitting: Family Medicine

## 2024-05-30 DIAGNOSIS — K8020 Calculus of gallbladder without cholecystitis without obstruction: Secondary | ICD-10-CM

## 2024-05-30 LAB — HEPATIC FUNCTION PANEL
ALT: 13 IU/L (ref 0–44)
AST: 15 IU/L (ref 0–40)
Albumin: 4.5 g/dL (ref 3.9–4.9)
Alkaline Phosphatase: 80 IU/L (ref 47–123)
Bilirubin Total: 0.2 mg/dL (ref 0.0–1.2)
Bilirubin, Direct: 0.08 mg/dL (ref 0.00–0.40)
Total Protein: 6.8 g/dL (ref 6.0–8.5)

## 2024-06-10 ENCOUNTER — Encounter: Payer: Self-pay | Admitting: Family Medicine

## 2024-09-04 ENCOUNTER — Ambulatory Visit: Admitting: Family Medicine

## 2025-01-22 ENCOUNTER — Ambulatory Visit
